# Patient Record
Sex: Male | Born: 1965 | Race: White | Hispanic: No | Marital: Married | Smoking: Current every day smoker
Health system: Southern US, Community
[De-identification: ages and names within clinical notes are randomized; demographics above are authoritative.]

## PROBLEM LIST (undated history)

## (undated) DIAGNOSIS — Z800 Family history of malignant neoplasm of digestive organs: Secondary | ICD-10-CM

## (undated) DIAGNOSIS — I10 Essential (primary) hypertension: Secondary | ICD-10-CM

## (undated) DIAGNOSIS — F32A Depression, unspecified: Secondary | ICD-10-CM

## (undated) DIAGNOSIS — I471 Supraventricular tachycardia, unspecified: Secondary | ICD-10-CM

## (undated) DIAGNOSIS — R000 Tachycardia, unspecified: Secondary | ICD-10-CM

## (undated) DIAGNOSIS — N400 Enlarged prostate without lower urinary tract symptoms: Secondary | ICD-10-CM

## (undated) DIAGNOSIS — F329 Major depressive disorder, single episode, unspecified: Secondary | ICD-10-CM

## (undated) DIAGNOSIS — N200 Calculus of kidney: Secondary | ICD-10-CM

## (undated) DIAGNOSIS — Z720 Tobacco use: Secondary | ICD-10-CM

## (undated) DIAGNOSIS — F419 Anxiety disorder, unspecified: Secondary | ICD-10-CM

## (undated) DIAGNOSIS — R1013 Epigastric pain: Secondary | ICD-10-CM

## (undated) DIAGNOSIS — E785 Hyperlipidemia, unspecified: Secondary | ICD-10-CM

## (undated) DIAGNOSIS — Z860100 Personal history of colon polyps, unspecified: Secondary | ICD-10-CM

## (undated) DIAGNOSIS — I2510 Atherosclerotic heart disease of native coronary artery without angina pectoris: Secondary | ICD-10-CM

## (undated) DIAGNOSIS — R0602 Shortness of breath: Secondary | ICD-10-CM

## (undated) DIAGNOSIS — E7800 Pure hypercholesterolemia, unspecified: Secondary | ICD-10-CM

## (undated) HISTORY — DX: Supraventricular tachycardia: I47.1

## (undated) HISTORY — DX: Family history of malignant neoplasm of digestive organs: Z80.0

## (undated) HISTORY — DX: Calculus of kidney: N20.0

## (undated) HISTORY — DX: Benign prostatic hyperplasia without lower urinary tract symptoms: N40.0

## (undated) HISTORY — DX: Supraventricular tachycardia, unspecified: I47.10

## (undated) HISTORY — DX: Anxiety disorder, unspecified: F41.9

## (undated) HISTORY — DX: Hyperlipidemia, unspecified: E78.5

## (undated) HISTORY — DX: Atherosclerotic heart disease of native coronary artery without angina pectoris: I25.10

## (undated) HISTORY — DX: Tobacco use: Z72.0

## (undated) HISTORY — DX: Epigastric pain: R10.13

## (undated) HISTORY — DX: Essential (primary) hypertension: I10

## (undated) HISTORY — DX: Shortness of breath: R06.02

## (undated) HISTORY — DX: Personal history of colon polyps, unspecified: Z86.0100

## (undated) HISTORY — DX: Personal history of colonic polyps: Z86.010

## (undated) HISTORY — PX: CARDIAC ELECTROPHYSIOLOGY STUDY AND ABLATION: SHX1294

## (undated) HISTORY — DX: Pure hypercholesterolemia, unspecified: E78.00

---

## 1998-01-22 ENCOUNTER — Emergency Department (HOSPITAL_COMMUNITY): Admission: EM | Admit: 1998-01-22 | Discharge: 1998-01-22 | Payer: Self-pay | Admitting: Emergency Medicine

## 2000-02-02 ENCOUNTER — Ambulatory Visit (HOSPITAL_COMMUNITY): Admission: RE | Admit: 2000-02-02 | Discharge: 2000-02-02 | Payer: Self-pay | Admitting: *Deleted

## 2000-02-02 ENCOUNTER — Encounter (INDEPENDENT_AMBULATORY_CARE_PROVIDER_SITE_OTHER): Payer: Self-pay | Admitting: Specialist

## 2000-11-12 ENCOUNTER — Ambulatory Visit (HOSPITAL_COMMUNITY): Admission: RE | Admit: 2000-11-12 | Discharge: 2000-11-12 | Payer: Self-pay | Admitting: *Deleted

## 2000-11-12 ENCOUNTER — Encounter (INDEPENDENT_AMBULATORY_CARE_PROVIDER_SITE_OTHER): Payer: Self-pay | Admitting: Specialist

## 2001-04-17 ENCOUNTER — Emergency Department (HOSPITAL_COMMUNITY): Admission: EM | Admit: 2001-04-17 | Discharge: 2001-04-17 | Payer: Self-pay | Admitting: Emergency Medicine

## 2006-05-28 ENCOUNTER — Emergency Department (HOSPITAL_COMMUNITY): Admission: EM | Admit: 2006-05-28 | Discharge: 2006-05-28 | Payer: Self-pay | Admitting: Emergency Medicine

## 2008-02-24 ENCOUNTER — Emergency Department (HOSPITAL_BASED_OUTPATIENT_CLINIC_OR_DEPARTMENT_OTHER): Admission: EM | Admit: 2008-02-24 | Discharge: 2008-02-24 | Payer: Self-pay | Admitting: Emergency Medicine

## 2010-10-14 NOTE — Procedures (Signed)
[facility]  Patient:    Patrick Morse, Patrick Morse                    MRN: [id number] Proc. Date: 02/02/00 Adm. Date:  [id number] Disc. Date: [id number] Attending:  Mingo Amber CC:         Marinda Elk, M.D.   Procedure Report  PROCEDURE:  Video upper endoscopy.  ENDOSCOPIST:  Roosvelt Harps, M.D.  INDICATIONS:  Persisting epigastric discomfort in a patient with a history of Helicobacter pylori gastritis.  He also probably alpha has some degree of underlying irritable bowel.  PREPARATION:  He is NPO since midnight.  PREPROCEDURE SEDATION:  He received 80 mg of Demerol and 10 mg of Versed intravenously.  In addition, his throat was anesthetized with Hurricaine spray, and he was on 2 liters of nasal cannula O2.  DESCRIPTION OF PROCEDURE:  The Olympus video upper endoscope was inserted via the mouth and advanced easily through the upper esophageal sphincter. Intubation was then carried out to the descending duodenum.  On withdrawal, the mucosa was carefully evaluated.  The descending duodenum and bulb appeared normal.  There was a small erosion of the pyloric channel seen on intubation. There was minimal body gastritis, but the antrum appeared normal.  Retroflexed view of the GE junction demonstrated a small nodule on the gastric side of doubtful significance which was biopsied x 2.  Trying to get better biopsies and evaluate the area further, the side-viewing scope was inserted after the forward viewing scope was withdrawn, but this did not really to help to clarify the nodule.  The distal esophagus appeared normal.  IMPRESSION:  Non-ulcer dyspepsia versus clinical reflux.  Minimal gastritis and a small gastroesophageal junction nodule.  There is also an erosion of the pyloric channel.  PLAN:  All of the above are relatively minor findings but I am going to place the patient on 40 mg of Protonix daily to see if he feels better.  CLO test will be  reviewed, and I will go over the results with him in a few weeks in the office. DD:  02/02/00 TD:  02/03/00 Job: [id number]

## 2011-12-15 ENCOUNTER — Emergency Department (HOSPITAL_BASED_OUTPATIENT_CLINIC_OR_DEPARTMENT_OTHER)
Admission: EM | Admit: 2011-12-15 | Discharge: 2011-12-15 | Disposition: A | Discharge status: Home / Self Care | Payer: BC Managed Care – PPO | Attending: Emergency Medicine | Admitting: Emergency Medicine

## 2011-12-15 ENCOUNTER — Encounter (HOSPITAL_BASED_OUTPATIENT_CLINIC_OR_DEPARTMENT_OTHER): Payer: Self-pay | Admitting: *Deleted

## 2011-12-15 ENCOUNTER — Emergency Department (HOSPITAL_BASED_OUTPATIENT_CLINIC_OR_DEPARTMENT_OTHER): Payer: BC Managed Care – PPO

## 2011-12-15 DIAGNOSIS — R0602 Shortness of breath: Secondary | ICD-10-CM | POA: Insufficient documentation

## 2011-12-15 DIAGNOSIS — F17200 Nicotine dependence, unspecified, uncomplicated: Secondary | ICD-10-CM | POA: Insufficient documentation

## 2011-12-15 HISTORY — DX: Major depressive disorder, single episode, unspecified: F32.9

## 2011-12-15 HISTORY — DX: Depression, unspecified: F32.A

## 2011-12-15 HISTORY — DX: Tachycardia, unspecified: R00.0

## 2011-12-15 LAB — BASIC METABOLIC PANEL
BUN: 14 mg/dL (ref 6–23)
Chloride: 100 mEq/L (ref 96–112)
GFR calc non Af Amer: 89 mL/min — ABNORMAL LOW (ref >90)
Potassium: 4.4 mEq/L (ref 3.5–5.1)

## 2011-12-15 LAB — CBC WITH DIFFERENTIAL/PLATELET
Basophils Relative: 0 % (ref 0–1)
Eosinophils Absolute: 0.1 10*3/uL (ref 0.0–0.7)
Eosinophils Relative: 1 % (ref 0–5)
Lymphs Abs: 1.9 10*3/uL (ref 0.7–4.0)
MCV: 88.9 fL (ref 78.0–100.0)
Neutro Abs: 7.9 10*3/uL — ABNORMAL HIGH (ref 1.7–7.7)
Platelets: 279 10*3/uL (ref 150–400)
WBC: 10.7 10*3/uL — ABNORMAL HIGH (ref 4.0–10.5)

## 2011-12-15 LAB — TROPONIN I: Troponin I: <0.3 ng/mL (ref <0.30)

## 2011-12-15 NOTE — ED Notes (Signed)
Pt. In no resp. Distress.  Pt. At time of discharge is eating and drinking with no noted resp. Distress.  Pt. Laughing and eating and has no cough noted and no labored breathing.

## 2011-12-15 NOTE — ED Provider Notes (Signed)
Medical screening examination/treatment/procedure(s) were performed by non-physician practitioner and as supervising physician I was immediately available for consultation/collaboration.   Charles B. Sheldon, MD 12/15/11 1541 

## 2011-12-15 NOTE — ED Notes (Signed)
Smoker with c.o sob when he works all week. This am at rest he was sob with non productive cough. Pt does not appear to be in resp distress. Pulse ox 100% r/a. Lungs are clear.

## 2011-12-15 NOTE — ED Notes (Signed)
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  States on Tues he was treated for stomach infection and prostatitis with cipro.  States the shortness of breath started after taking cipro.

## 2011-12-15 NOTE — ED Provider Notes (Signed)
History     CSN: [id number]  Arrival date & time 12/15/11  1148   First MD Initiated Contact with Patient 12/15/11 1211      Chief Complaint  Patient presents with  . Shortness of Breath    (Consider location/radiation/quality/duration/timing/severity/associated sxs/prior treatment) HPI Comments: Pt states that he is not having any cp:pt has a history of svt with similar symptoms but has not had problems since his ablation several years ago  Patient is a 46 y.o. male presenting with shortness of breath. The history is provided by the patient. No language interpreter was used.  Shortness of Breath  The current episode started 5 to 7 days ago. The problem occurs occasionally. The problem has been unchanged. The problem is moderate. Nothing relieves the symptoms. Nothing aggravates the symptoms. Associated symptoms include cough and shortness of breath. Pertinent negatives include no chest pain and no wheezing. He was not exposed to toxic fumes. He has not inhaled smoke recently. He has had no prior hospitalizations. He has had no prior ICU admissions. He has had no prior intubations.    Past Medical History  Diagnosis Date  . Tachycardia   . Depression     Past Surgical History  Procedure Date  . Cardiac electrophysiology study and ablation     No family history on file.  History  Substance Use Topics  . Smoking status: Current Everyday Smoker -- 0.5 packs/day    Types: Cigarettes  . Smokeless tobacco: Not on file  . Alcohol Use: Yes      Review of Systems  Constitutional: Negative.   Respiratory: Positive for cough and shortness of breath. Negative for wheezing.   Cardiovascular: Negative for chest pain.  Neurological: Negative.     Allergies  Review of patient's allergies indicates no known allergies.  Home Medications   Current Outpatient Rx  Name Route Sig Dispense Refill  . CIPROFLOXACIN 500 MG/5ML (10%) PO SUSR Oral Take 500 mg by mouth 2 (two) times  daily.    Marland Kitchen PAROXETINE HCL 20 MG PO TABS Oral Take 20 mg by mouth every morning.      BP 133/86  Pulse 74  Temp 98.4 F (36.9 C) (Oral)  Resp 18  Ht 5\' 9"  (1.753 m)  Wt 160 lb (72.576 kg)  BMI 23.63 kg/m2  SpO2 100%  Physical Exam  Nursing note and vitals reviewed. Constitutional: He appears well-developed and well-nourished.  HENT:  Head: Normocephalic and atraumatic.  Eyes: Conjunctivae and EOM are normal.  Neck: Neck supple.  Cardiovascular: Normal rate and regular rhythm.   Pulmonary/Chest: Effort normal and breath sounds normal.  Abdominal: Soft. Bowel sounds are normal.  Musculoskeletal: Normal range of motion.  Neurological: He is alert.  Skin: Skin is warm and dry.    ED Course  Procedures (including critical care time)  Labs Reviewed  CBC WITH DIFFERENTIAL - Abnormal; Notable for the following:    WBC 10.7 (*)     Neutro Abs 7.9 (*)     All other components within normal limits  BASIC METABOLIC PANEL - Abnormal; Notable for the following:    Glucose, Bld 107 (*)     GFR calc non Af Amer 89 (*)     All other components within normal limits  TROPONIN I   Dg Chest 2 View  12/15/2011  *RADIOLOGY REPORT*  Clinical Data: Shortness of breath, smoker  CHEST - 2 VIEW  Comparison: None  Findings: Normal heart size, mediastinal contours, and pulmonary vascularity. Lungs clear.  Bones unremarkable. No pneumothorax.  IMPRESSION: Normal exam.  Original Report Authenticated By: Lollie Marrow, M.D.    Date: 12/15/2011  Rate: 65  Rhythm: normal sinus rhythm  QRS Axis: normal  Intervals: normal  ST/T Wave abnormalities: normal  Conduction Disutrbances:none  Narrative Interpretation:   Old EKG Reviewed: none available    1. SOB (shortness of breath)       MDM  Pt not hypoxic or tachycardic:pt in no acute distress:doubt acs or ZO:[id number] with pt possibility of anxiety        Teressa Lower, NP 12/15/11 1356

## 2011-12-15 NOTE — ED Notes (Signed)
Family at bedside. 

## 2011-12-15 NOTE — ED Notes (Signed)
RT called to assess patient at triage. No distress was noted at this time. BBS clear, SAT 100% on RA. RT will continue to monitor.

## 2012-09-05 ENCOUNTER — Emergency Department (HOSPITAL_COMMUNITY)
Admission: EM | Admit: 2012-09-05 | Discharge: 2012-09-05 | Disposition: A | Discharge status: Home / Self Care | Payer: Managed Care, Other (non HMO) | Attending: Emergency Medicine | Admitting: Emergency Medicine

## 2012-09-05 ENCOUNTER — Emergency Department (HOSPITAL_COMMUNITY): Payer: Managed Care, Other (non HMO)

## 2012-09-05 ENCOUNTER — Encounter (HOSPITAL_COMMUNITY): Payer: Self-pay | Admitting: Emergency Medicine

## 2012-09-05 DIAGNOSIS — F17200 Nicotine dependence, unspecified, uncomplicated: Secondary | ICD-10-CM | POA: Insufficient documentation

## 2012-09-05 DIAGNOSIS — Z8679 Personal history of other diseases of the circulatory system: Secondary | ICD-10-CM | POA: Insufficient documentation

## 2012-09-05 DIAGNOSIS — M549 Dorsalgia, unspecified: Secondary | ICD-10-CM

## 2012-09-05 DIAGNOSIS — M545 Low back pain, unspecified: Secondary | ICD-10-CM | POA: Insufficient documentation

## 2012-09-05 DIAGNOSIS — F3289 Other specified depressive episodes: Secondary | ICD-10-CM | POA: Insufficient documentation

## 2012-09-05 DIAGNOSIS — Z79899 Other long term (current) drug therapy: Secondary | ICD-10-CM | POA: Insufficient documentation

## 2012-09-05 DIAGNOSIS — F329 Major depressive disorder, single episode, unspecified: Secondary | ICD-10-CM | POA: Insufficient documentation

## 2012-09-05 MED ORDER — OXYCODONE-ACETAMINOPHEN 7.5-325 MG PO TABS: 1.0000 | ORAL_TABLET | ORAL | Status: DC | PRN

## 2012-09-05 MED ORDER — HYDROMORPHONE HCL PF 1 MG/ML IJ SOLN
1.0000 mg | Freq: Once | INTRAMUSCULAR | Status: AC
  Administered 2012-09-05: 1 mg via INTRAVENOUS
  Filled 2012-09-05: qty 1

## 2012-09-05 MED ORDER — FENTANYL CITRATE 0.05 MG/ML IJ SOLN: 100.0000 ug | Freq: Once | INTRAMUSCULAR | Status: DC

## 2012-09-05 MED ORDER — FENTANYL CITRATE 0.05 MG/ML IJ SOLN
50.0000 ug | Freq: Once | INTRAMUSCULAR | Status: AC
  Administered 2012-09-05: 50 ug via INTRAVENOUS
  Filled 2012-09-05: qty 2

## 2012-09-05 MED ORDER — KETOROLAC TROMETHAMINE 30 MG/ML IJ SOLN
30.0000 mg | Freq: Once | INTRAMUSCULAR | Status: AC
  Administered 2012-09-05: 30 mg via INTRAVENOUS
  Filled 2012-09-05: qty 1

## 2012-09-05 MED ORDER — CYCLOBENZAPRINE HCL 10 MG PO TABS: 5.0000 mg | ORAL_TABLET | Freq: Three times a day (TID) | ORAL | Status: DC

## 2012-09-05 MED ORDER — IBUPROFEN 600 MG PO TABS: 600.0000 mg | ORAL_TABLET | Freq: Four times a day (QID) | ORAL | Status: DC | PRN

## 2012-09-05 MED ORDER — MORPHINE SULFATE 4 MG/ML IJ SOLN
4.0000 mg | Freq: Once | INTRAMUSCULAR | Status: AC
  Administered 2012-09-05: 4 mg via INTRAVENOUS
  Filled 2012-09-05: qty 1

## 2012-09-05 NOTE — ED Notes (Signed)
WUJ:[id number]<JY> Expected date:<BR> Expected time:<BR> Means of arrival:Ambulance<BR> Comments:<BR> 46yoM,back pain/hs of bulging disc

## 2012-09-05 NOTE — ED Notes (Signed)
Patient transported to MRI 

## 2012-09-05 NOTE — ED Notes (Signed)
MD at bedside. 

## 2012-09-05 NOTE — ED Notes (Signed)
Pt complains of lower back pain

## 2012-09-05 NOTE — ED Provider Notes (Signed)
History     CSN: [id number]  Arrival date & time 09/05/12  1330   First MD Initiated Contact with Patient 09/05/12 1432      Chief Complaint  Patient presents with  . Back Pain    (Consider location/radiation/quality/duration/timing/severity/associated sxs/prior treatment) HPI Comments: Pt presenting to the ED for increasing low back pain x 7 days.  Currently works as a Systems developer and moving heavy object but does not recall a specific event that set off his pain.  No recent trauma or injury.  Pain is worse with walking, so severe today that he was unable to ambulate at home PTA.  Notes that when he walks, he feels like his pelvis is tilted to the right.  Has been seeing the chiropractor for approx 2 weeks without significant improvement of his sx.  Prior back injury several years ago that was treated with cortisone injections.  Denies any numbness or paresthesias of LE.  No loss of bowel or bladder function.    The history is provided by the patient.    Past Medical History  Diagnosis Date  . Tachycardia   . Depression     Past Surgical History  Procedure Laterality Date  . Cardiac electrophysiology study and ablation      No family history on file.  History  Substance Use Topics  . Smoking status: Current Every Day Smoker -- 0.50 packs/day    Types: Cigarettes  . Smokeless tobacco: Not on file  . Alcohol Use: Yes      Review of Systems  Musculoskeletal: Positive for back pain.  All other systems reviewed and are negative.    Allergies  Review of patient's allergies indicates no known allergies.  Home Medications   Current Outpatient Rx  Name  Route  Sig  Dispense  Refill  . acetaminophen (TYLENOL) 325 MG tablet   Oral   Take 650 mg by mouth every 6 (six) hours as needed for pain.         Marland Kitchen FLUoxetine (PROZAC) 20 MG capsule   Oral   Take 20 mg by mouth daily.         Marland Kitchen HYDROcodone-acetaminophen (VICODIN) 5-500 MG per tablet   Oral   Take 1  tablet by mouth every 6 (six) hours as needed for pain.           BP 149/90  Pulse 82  Temp(Src) 98.9 F (37.2 C) (Oral)  Resp 19  SpO2 97%  Physical Exam  Nursing note and vitals reviewed. Constitutional: He is oriented to person, place, and time. He appears well-developed and well-nourished.  HENT:  Head: Normocephalic and atraumatic.  Mouth/Throat: Oropharynx is clear and moist.  Eyes: Conjunctivae and EOM are normal. Pupils are equal, round, and reactive to light.  Neck: Normal range of motion.  Cardiovascular: Normal rate, regular rhythm and normal heart sounds.   Pulmonary/Chest: Effort normal and breath sounds normal.  Abdominal: Soft. Bowel sounds are normal. There is no tenderness. There is no guarding.  Musculoskeletal: Normal range of motion. He exhibits no edema.       Lumbar back: He exhibits tenderness, bony tenderness and pain. He exhibits no swelling, no edema, no deformity, no laceration, no spasm and normal pulse.  LE sensation intact, strong distal pulse and cap refill  Neurological: He is alert and oriented to person, place, and time.  Skin: Skin is warm and dry.  Psychiatric: He has a normal mood and affect.    ED Course  Procedures (  including critical care time)  Labs Reviewed - No data to display Dg Lumbar Spine Complete  09/05/2012  *RADIOLOGY REPORT*  Clinical Data: Left back pain  LUMBAR SPINE - COMPLETE 4+ VIEW  Comparison: MRI lumbar spine dated 08/20/2006  Findings: Five lumbar-type vertebral bodies.  Normal lumbar lordosis.  No evidence of fracture or dislocation.  Vertebral body heights are maintained.  Mild degenerative changes at L4-5 and L5-S1.  Visualized bony pelvis appears intact.  IMPRESSION: No fracture or dislocation is seen.  Mild degenerative changes at L4-5 and L5-S1.   Original Report Authenticated By: Charline Bills, M.D.    Mr Lumbar Spine Wo Contrast  09/05/2012  *RADIOLOGY REPORT*  Clinical Data: Back pain.  Unable to walk.   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  MRI LUMBAR SPINE WITHOUT CONTRAST  Technique:  Multiplanar and multiecho pulse sequences of the lumbar spine were obtained without intravenous contrast.  Comparison: Lumbar spine radiographs 09/05/2012.  MRI lumbar spine without and with contrast 08/20/2006.  Findings: Normal signal is present in the conus medullaris which terminates at L1.  Marrow signal is mildly heterogeneous. Limited imaging of the abdomen is unremarkable.  The disc levels at L3-4 and above are normal.  L4-5:  A leftward disc herniation is slightly more prominent than on the prior study.  This results in mild left foraminal stenosis. The central canal is patent.  L5-S1:  A mild broad-based disc herniation is similar to the prior study.  Facet hypertrophy is present as well.  This results in mild bilateral foraminal narrowing, slightly worse than on the prior exam.  IMPRESSION:  1.  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 2.  Progression of mild bilateral foraminal narrowing at L5-S1. 3.  No other significant disc herniation or stenosis.   Original Report Authenticated By: Marin Roberts, M.D.      1. Back pain       MDM   Pt presenting to the ED with low back pain increasing over the past 7 days.  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  Attempted to obtain records from chiropractors office but x-rays were illegible.  X-rays repeated here in the ED revealed mild DJD L4-L5, L5-S1.  Pt adamant that he is unable to walk.  Discussed with Dr. Radford Pax and obtained MRI which revealed slightly worsening foraminal narrowing when compared to previous study from 2008.  Long discussion with pt and family members about DJD and herniated discs- will need monitoring over time.  Pt has previously scheduled FU appt tomorrow with [facility]- Dr. Regino Schultze.  Encouraged to keep this appt.  Rx percocet, flexeril, ibuprofen.  Pain control will be achieved prior to d/c- Dr.  Radford Pax to monitor and set disposition when appropriate.       Garlon Hatchet, PA-C 09/06/12 1002

## 2012-09-08 NOTE — ED Provider Notes (Signed)
Medical screening examination/treatment/procedure(s) were conducted as a shared visit with non-physician practitioner(s) and myself.  I personally evaluated the patient during the encounter   Nelia Shi, MD 09/08/12 1328

## 2020-01-13 ENCOUNTER — Ambulatory Visit: Payer: Self-pay

## 2020-01-13 ENCOUNTER — Other Ambulatory Visit: Payer: Self-pay

## 2020-01-13 DIAGNOSIS — Z20822 Contact with and (suspected) exposure to covid-19: Secondary | ICD-10-CM

## 2020-01-14 LAB — SARS-COV-2, NAA 2 DAY TAT

## 2020-01-14 LAB — NOVEL CORONAVIRUS, NAA: SARS-CoV-2, NAA: NOT DETECTED

## 2020-01-15 ENCOUNTER — Ambulatory Visit: Payer: 59 | Attending: Internal Medicine

## 2020-01-15 DIAGNOSIS — Z23 Encounter for immunization: Secondary | ICD-10-CM

## 2020-01-15 NOTE — Progress Notes (Signed)
   Covid-19 Vaccination Clinic  Name:  ARMAS MCBEE    MRN: [id number] DOB: Dec 14, 1965  01/15/2020  Mr. Haslam was observed post Covid-19 immunization for 15 minutes without incident. He was provided with Vaccine Information Sheet and instruction to access the V-Safe system.   Mr. Avitabile was instructed to call 911 with any severe reactions post vaccine: Marland Kitchen Difficulty breathing  . Swelling of face and throat  . A fast heartbeat  . A bad rash all over body  . Dizziness and weakness   Immunizations Administered    Name Date Dose VIS Date Route   Pfizer COVID-19 Vaccine 01/15/2020  9:44 AM 0.3 mL 07/23/2018 Intramuscular   Manufacturer: [employer], [employer]   Lot: [id number]   NDC: [address]-7

## 2020-02-05 ENCOUNTER — Ambulatory Visit: Payer: Managed Care, Other (non HMO) | Attending: Critical Care Medicine

## 2020-02-05 DIAGNOSIS — Z23 Encounter for immunization: Secondary | ICD-10-CM

## 2020-02-05 NOTE — Progress Notes (Signed)
   Covid-19 Vaccination Clinic  Name:  Patrick Morse    MRN: [id number] DOB: 1966/05/02  02/05/2020  [name] was observed post Covid-19 immunization for 15 minutes without incident. He was provided with Vaccine Information Sheet and instruction to access the V-Safe system.   [name] was instructed to call 911 with any severe reactions post vaccine: Marland Kitchen Difficulty breathing  . Swelling of face and throat  . A fast heartbeat  . A bad rash all over body  . Dizziness and weakness   Immunizations Administered    Name Date Dose VIS Date Route   Pfizer COVID-19 Vaccine 02/05/2020  9:29 AM 0.3 mL 07/23/2018 Intramuscular   Manufacturer: [employer], [employer]   Lot: [id number]   NDC: [address]-9

## 2020-02-10 ENCOUNTER — Other Ambulatory Visit: Payer: Self-pay

## 2020-02-12 ENCOUNTER — Ambulatory Visit: Payer: Self-pay | Admitting: Family Medicine

## 2020-04-21 ENCOUNTER — Encounter: Payer: Self-pay | Admitting: Cardiology

## 2020-04-21 ENCOUNTER — Other Ambulatory Visit: Payer: Self-pay

## 2020-04-21 ENCOUNTER — Ambulatory Visit: Payer: 59 | Admitting: Cardiology

## 2020-04-21 VITALS — BP 128/82 | HR 63 | Ht 69.0 in | Wt 159.0 lb

## 2020-04-21 DIAGNOSIS — R0600 Dyspnea, unspecified: Secondary | ICD-10-CM

## 2020-04-21 DIAGNOSIS — R0609 Other forms of dyspnea: Secondary | ICD-10-CM

## 2020-04-21 DIAGNOSIS — I10 Essential (primary) hypertension: Secondary | ICD-10-CM | POA: Diagnosis not present

## 2020-04-21 DIAGNOSIS — I471 Supraventricular tachycardia: Secondary | ICD-10-CM | POA: Diagnosis not present

## 2020-04-21 MED ORDER — METOPROLOL TARTRATE 100 MG PO TABS: ORAL_TABLET | ORAL | 0 refills | Status: DC

## 2020-04-21 NOTE — Patient Instructions (Addendum)
Medication Instructions:  Your physician recommends that you continue on your current medications as directed. Please refer to the Current Medication list given to you today.  *If you need a refill on your cardiac medications before your next appointment, please call your pharmacy*   Testing/Procedures: Your physician has requested that you have an echocardiogram. Echocardiography is a painless test that uses sound waves to create images of your heart. It provides your doctor with information about the size and shape of your heart and how well your heart's chambers and valves are working. This procedure takes approximately one hour. There are no restrictions for this procedure.  Your physician has requested that you have a coronary CTA scan done. Please see next page for further instructions.  Follow-Up: At [facility], you and your health needs are our priority.  As part of our continuing mission to provide you with exceptional heart care, we have created designated Provider Care Teams.  These Care Teams include your primary Cardiologist (physician) and Advanced Practice Providers (APPs -  Physician Assistants and Nurse Practitioners) who all work together to provide you with the care you need, when you need it.  Follow up with Dr. Radford Pax as needed based on results of testing.    Other Instructions Your cardiac CT will be scheduled at:   [facility] [address] [place], [place] [address] [phone]  Please arrive at the [facility] main entrance of [facility] 30 minutes prior to test start time. Proceed to the [facility] Radiology Department (first floor) to check-in and test prep.  Please follow these instructions carefully (unless otherwise directed):  Hold all erectile dysfunction medications at least 3 days (72 hrs) prior to test.  On the Night Before the Test: . Be sure to Drink plenty of water. . Do not consume any caffeinated/decaffeinated  beverages or chocolate 12 hours prior to your test. . Do not take any antihistamines 12 hours prior to your test.  On the Day of the Test: . Drink plenty of water. Do not drink any water within one hour of the test. . Do not eat any food 4 hours prior to the test. . You may take your regular medications prior to the test.  . Take metoprolol (Lopressor) two hours prior to test.  After the Test: . Drink plenty of water. . After receiving IV contrast, you may experience a mild flushed feeling. This is normal. . On occasion, you may experience a mild rash up to 24 hours after the test. This is not dangerous. If this occurs, you can take Benadryl 25 mg and increase your fluid intake. . If you experience trouble breathing, this can be serious. If it is severe call 911 IMMEDIATELY. If it is mild, please call our office. . If you take any of these medications: Glipizide/Metformin, Avandament, Glucavance, please do not take 48 hours after completing test unless otherwise instructed.   Once we have confirmed authorization from your insurance company, we will call you to set up a date and time for your test. Based on how quickly your insurance processes prior authorizations requests, please allow up to 4 weeks to be contacted for scheduling your Cardiac CT appointment. Be advised that routine Cardiac CT appointments could be scheduled as many as 8 weeks after your provider has ordered it.  For non-scheduling related questions, please contact the cardiac imaging nurse navigator should you have any questions/concerns: Patrick Morse, Cardiac Imaging Nurse Navigator [place], [facility]  [facility] Direct Office Dial: [phone]   For scheduling needs, including cancellations and rescheduling, please call Patrick Morse, [phone] (temporary number).

## 2020-04-21 NOTE — Progress Notes (Signed)
Cardiology Consult  Note    Date:  04/21/2020   ID:  JAXXSON CAVANAH, DOB Oct 06, 1965, MRN [id number]  PCP:  Joycelyn Rua, MD  Cardiologist:  Armanda Magic, MD   Chief Complaint  Patient presents with  . New Patient (Initial Visit)    DOE    History of Present Illness:  Patrick Morse is a 54 y.o. male who is being seen today for the evaluation of DOE at the request of Joycelyn Rua, MD.  This is a 54yo male with a hx of depression and anxiety, HLD, HTN, SVT s/p remote ablation (2003 [facility] Dr. Sampson Goon) and tobacco abuse who is referred for evaluation of DOE.  He says that he has had a dull HA for several months and fatigue.  He says that when he is driving down the road he will have sudden onset of diaphoresis, DOE and dizziness.  His father had a CVA in his mid 72's.  He does smoke < 1ppd for about 30 years.  He has noticed that he will feel SOB and the last time it happened was when he was climbing up a hole at work and got out of breath.  He has noticed that his SOB can happen at anytime both at rest and with exertion.  He denies any LE edema, PND, orthopnea or syncope.  Occasionally he will notice a skipped heart beat.  He says that he is very stressed out recently and has 2 jobs and 2 kids and recently separated from his wife.   Past Medical History:  Diagnosis Date  . Anxiety   . Bilateral kidney stones   . BPH (benign prostatic hyperplasia)   . Depression   . Dyspepsia   . Epigastric pain   . Family hx of colon cancer   . High cholesterol   . Hx of colonic polyps   . Hyperlipidemia   . Hypertension   . SOB (shortness of breath)   . SVT (supraventricular tachycardia) [facility])    s/p ablation at [facility] Dr. Sampson Goon 2003  . Tobacco abuse     Past Surgical History:  Procedure Laterality Date  . CARDIAC ELECTROPHYSIOLOGY STUDY AND ABLATION      Current Medications: Current Meds  Medication Sig  . acetaminophen (TYLENOL) 325 MG tablet Take 650 mg by mouth  every 6 (six) hours as needed for pain.  Marland Kitchen ibuprofen (ADVIL,MOTRIN) 600 MG tablet Take 1 tablet (600 mg total) by mouth every 6 (six) hours as needed for pain.  Marland Kitchen lisinopril (ZESTRIL) 10 MG tablet Take 10 mg by mouth daily.  . [DISCONTINUED] tamsulosin (FLOMAX) 0.4 MG CAPS capsule Take by mouth.    Allergies:   Patient has no known allergies.   Social History   Socioeconomic History  . Marital status: Married    Spouse name: Not on file  . Number of children: Not on file  . Years of education: Not on file  . Highest education level: Not on file  Occupational History  . Not on file  Tobacco Use  . Smoking status: Current Every Day Smoker    Packs/day: 0.50    Types: Cigarettes  . Smokeless tobacco: Current User  Substance and Sexual Activity  . Alcohol use: Yes  . Drug use: Yes    Types: Marijuana  . Sexual activity: Not on file  Other Topics Concern  . Not on file  Social History Narrative  . Not on file   Social Determinants of Health   Financial  Resource Strain:   . Difficulty of Paying Living Expenses: Not on file  Food Insecurity:   . Worried About Programme researcher, broadcasting/film/video in the Last Year: Not on file  . Ran Out of Food in the Last Year: Not on file  Transportation Needs:   . Lack of Transportation (Medical): Not on file  . Lack of Transportation (Non-Medical): Not on file  Physical Activity:   . Days of Exercise per Week: Not on file  . Minutes of Exercise per Session: Not on file  Stress:   . Feeling of Stress : Not on file  Social Connections:   . Frequency of Communication with Friends and Family: Not on file  . Frequency of Social Gatherings with Friends and Family: Not on file  . Attends Religious Services: Not on file  . Active Member of Clubs or Organizations: Not on file  . Attends Banker Meetings: Not on file  . Marital Status: Not on file     Family History:  The patient's family history is not on file.   ROS:   Please see the  history of present illness.    ROS All other systems reviewed and are negative.  No flowsheet data found.     PHYSICAL EXAM:   VS:  BP 128/82   Pulse 63   Ht 5\' 9"  (1.753 m)   Wt 159 lb (72.1 kg)   SpO2 98%   BMI 23.48 kg/m    GEN: Well nourished, well developed, in no acute distress  HEENT: normal  Neck: no JVD, carotid bruits, or masses Cardiac: RRR; no murmurs, rubs, or gallops,no edema.  Intact distal pulses bilaterally.  Respiratory:  clear to auscultation bilaterally, normal work of breathing GI: soft, nontender, nondistended, + BS MS: no deformity or atrophy  Skin: warm and dry, no rash Neuro:  Alert and Oriented x 3, Strength and sensation are intact Psych: euthymic mood, full affect  Wt Readings from Last 3 Encounters:  04/21/20 159 lb (72.1 kg)  12/15/11 160 lb (72.6 kg)      Studies/Labs Reviewed:   EKG:  EKG is ordered today.  The ekg ordered today demonstrates NSR with normal intervals  Recent Labs: No results found for requested labs within last 8760 hours.   Lipid Panel No results found for: CHOL, TRIG, HDL, CHOLHDL, VLDL, LDLCALC, LDLDIRECT     Additional studies/ records that were reviewed today include:  EKG    ASSESSMENT:    1. DOE (dyspnea on exertion)   2. Primary hypertension   3. SVT (supraventricular tachycardia) (HCC)      PLAN:  In order of problems listed above:  1. DOE -I am concerned about the symptoms he is having>>he will have sudden onset of diaphoresis, fatigue and SOB both with exertion and at rest -his CRFs include HTN, HLD, tobacco abuse and fm hx of vascular dz with his father having CVA in his 79's -check 2D echo to assess LVF -coronary CTA to define coronary anatomy  2. HTN -BP controlled on exam -continue Lisinopril 5 mg daily  3.  SVT -s/p remote ablation 2003 -he has not had any further tachycardia    Medication Adjustments/Labs and Tests Ordered: Current medicines are reviewed at length with the  patient today.  Concerns regarding medicines are outlined above.  Medication changes, Labs and Tests ordered today are listed in the Patient Instructions below.  There are no Patient Instructions on file for this visit.   Signed, 2004,  MD  04/21/2020 9:22 AM    [facility] [address] [place], [place], [place]  [address] Phone: [phone]; Fax: [phone]

## 2020-04-21 NOTE — Addendum Note (Signed)
Addended by: Theresia Majors on: 04/21/2020 09:30 AM   Modules accepted: Orders

## 2020-04-29 ENCOUNTER — Telehealth: Payer: Self-pay | Admitting: Cardiology

## 2020-04-29 ENCOUNTER — Telehealth: Payer: Self-pay | Admitting: Radiology

## 2020-04-29 DIAGNOSIS — R55 Syncope and collapse: Secondary | ICD-10-CM

## 2020-04-29 DIAGNOSIS — R42 Dizziness and giddiness: Secondary | ICD-10-CM

## 2020-04-29 NOTE — Telephone Encounter (Signed)
Enrolled patient for a 14 day Zio AT monitor to be mailed to patients home.  

## 2020-04-29 NOTE — Telephone Encounter (Signed)
Will place order for Zio monitor. Called and updated patient with Dr. Norris Cross additional recommendations.   Corrin Parker, PA-C 04/29/2020 4:03 PM

## 2020-04-29 NOTE — Telephone Encounter (Signed)
Reviewed chart. Patient recently seen by Dr. Mayford Knife on 04/21/2020 for further evaluation of dyspnea on exertion. He reported sudden onset of diaphoresis, dyspnea on exertion, and dizziness. Coronary CTA and Echo were ordered for further evaluation but have not been done yet. Called and spoke with patient. He reports he continues to have episodes of dizziness and states he feels like this is beginning to interfere with his ability to work. 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 One time he felt like he was going to pass out. No syncope though. Yesterday, he also had severe headache along the back of his head. No stroke like symptoms. No chest pain. Vitals normal with BP in the 130's/80's and HR in the 80's as well.  Will route note to Dr. Mayford Knife for additional recommendations sent she saw him last week for this. However, I did recommend that he go to the ED if he has worsening symptoms such as severe dizziness or near syncope/syncope.   Corrin Parker, PA-C 04/29/2020 3:03 PM

## 2020-04-29 NOTE — Telephone Encounter (Signed)
STAT if patient feels like he/she is going to faint   1) Are you dizzy now? yes  2) Do you feel faint or have you passed out? Pt just feels faint  3) Do you have any other symptoms? headache  4) Have you checked your HR and BP (record if available)? no  Patient says his sx are keeping him from doing his job. He wanted to go to the ER but its not sure what to do. Please advise

## 2020-04-29 NOTE — Telephone Encounter (Signed)
Please get an 2 week ziopatch and have him check his BP when he has these episodes

## 2020-05-04 ENCOUNTER — Ambulatory Visit (INDEPENDENT_AMBULATORY_CARE_PROVIDER_SITE_OTHER): Payer: 59

## 2020-05-04 DIAGNOSIS — R55 Syncope and collapse: Secondary | ICD-10-CM

## 2020-05-04 DIAGNOSIS — R42 Dizziness and giddiness: Secondary | ICD-10-CM | POA: Diagnosis not present

## 2020-05-04 NOTE — Telephone Encounter (Signed)
Misty Stanley with [employer] is calling with an update regarding Zio monitor. She states the patient declined wearing the monitor due to expenses.   Phone#: [phone] (reference#: [id number])

## 2020-05-05 ENCOUNTER — Telehealth: Payer: Self-pay | Admitting: Cardiology

## 2020-05-05 ENCOUNTER — Telehealth (HOSPITAL_COMMUNITY): Payer: Self-pay | Admitting: *Deleted

## 2020-05-05 NOTE — Telephone Encounter (Signed)
Reaching out to patient to offer assistance regarding upcoming cardiac imaging study; pt verbalizes understanding of appt date/time, parking situation and where to check in, pre-test NPO status and medications ordered, and verified current allergies; name and call back number provided for further questions should they arise  [place] [facility] [phone] office [phone] cell

## 2020-05-05 NOTE — Telephone Encounter (Signed)
Pt called in and stated that he had a echo and ct sched.  Per ins it is going to cost him 1200 each and he can not afford that.  He is wearing the monitor , he would like to know how he should proceed from here ?     Best number [phone]

## 2020-05-06 NOTE — Telephone Encounter (Signed)
Spoke with the patient who states that he is not able to afford both the echocardiogram and CT scan. He would like to know if he was only to get one done which one Dr. Mayford Knife would prefer.  He would also like to know about a payment plan for the tests. I advised him that I would send a message to our billing department for further advisement.

## 2020-05-07 ENCOUNTER — Ambulatory Visit (HOSPITAL_COMMUNITY): Admission: RE | Admit: 2020-05-07 | Payer: 59 | Source: Ambulatory Visit

## 2020-05-07 NOTE — Telephone Encounter (Signed)
Coronary CTA

## 2020-05-10 NOTE — Telephone Encounter (Signed)
Left message for patient advising him that Dr. Mayford Knife recommends that he have the coronary CTA scan done if he is only able to go through with one of the tests. Advised to call back with any questions.

## 2020-05-11 NOTE — Telephone Encounter (Signed)
Spoke with the patient who states that he would like to go through we the coronary CTA. I have cancelled his echocardiogram. Advised him that I would route to our CT schedulers who will be in contact with him to have it set up.

## 2020-05-18 ENCOUNTER — Other Ambulatory Visit (HOSPITAL_COMMUNITY): Payer: 59

## 2020-05-27 ENCOUNTER — Telehealth (HOSPITAL_COMMUNITY): Payer: Self-pay | Admitting: Emergency Medicine

## 2020-05-27 NOTE — Telephone Encounter (Signed)
Reaching out to patient to offer assistance regarding upcoming cardiac imaging study; pt verbalizes understanding of appt date/time, parking situation and where to check in, pre-test NPO status and medications ordered, and verified current allergies; name and call back number provided for further questions should they arise Tyra Gural RN Navigator Cardiac Imaging [facility] [phone] office [phone] cell  Pt to take 100mg metop 2 hr prior to scan Merek Niu  

## 2020-05-31 ENCOUNTER — Encounter (HOSPITAL_COMMUNITY): Payer: Self-pay

## 2020-05-31 ENCOUNTER — Ambulatory Visit (HOSPITAL_COMMUNITY)
Admission: RE | Admit: 2020-05-31 | Discharge: 2020-05-31 | Disposition: A | Discharge status: Home / Self Care | Payer: 59 | Source: Ambulatory Visit | Attending: Cardiology | Admitting: Cardiology

## 2020-05-31 ENCOUNTER — Other Ambulatory Visit: Payer: Self-pay

## 2020-05-31 DIAGNOSIS — I2510 Atherosclerotic heart disease of native coronary artery without angina pectoris: Secondary | ICD-10-CM

## 2020-05-31 DIAGNOSIS — R0600 Dyspnea, unspecified: Secondary | ICD-10-CM | POA: Diagnosis present

## 2020-05-31 DIAGNOSIS — R0609 Other forms of dyspnea: Secondary | ICD-10-CM

## 2020-05-31 MED ORDER — NITROGLYCERIN 0.4 MG SL SUBL
0.8000 mg | SUBLINGUAL_TABLET | Freq: Once | SUBLINGUAL | Status: AC
  Administered 2020-05-31: 0.8 mg via SUBLINGUAL

## 2020-05-31 MED ORDER — IOHEXOL 350 MG/ML SOLN
80.0000 mL | Freq: Once | INTRAVENOUS | Status: AC
  Administered 2020-05-31: 80 mL via INTRAVENOUS

## 2020-05-31 MED ORDER — NITROGLYCERIN 0.4 MG SL SUBL
SUBLINGUAL_TABLET | SUBLINGUAL | Status: AC
  Filled 2020-05-31: qty 2

## 2020-06-01 ENCOUNTER — Telehealth: Payer: Self-pay

## 2020-06-01 ENCOUNTER — Other Ambulatory Visit: Payer: Self-pay

## 2020-06-01 DIAGNOSIS — R0600 Dyspnea, unspecified: Secondary | ICD-10-CM

## 2020-06-01 DIAGNOSIS — I2583 Coronary atherosclerosis due to lipid rich plaque: Secondary | ICD-10-CM

## 2020-06-01 DIAGNOSIS — R0609 Other forms of dyspnea: Secondary | ICD-10-CM

## 2020-06-01 DIAGNOSIS — I2510 Atherosclerotic heart disease of native coronary artery without angina pectoris: Secondary | ICD-10-CM

## 2020-06-01 MED ORDER — ASPIRIN EC 81 MG PO TBEC: 81.0000 mg | DELAYED_RELEASE_TABLET | Freq: Every day | ORAL | 3 refills | Status: DC

## 2020-06-01 NOTE — Progress Notes (Signed)
Attestation for [employer] needs to be completed.

## 2020-06-01 NOTE — Telephone Encounter (Signed)
-----   Message from Quintella Reichert, MD sent at 05/31/2020  2:43 PM EST ----- Coronary CTA showed mild atherosclerosis of the LAD.  FFR has been submitted and is pending.Please get a copy of last FLP and ALT. Start ASA 81mg  daily

## 2020-06-01 NOTE — Progress Notes (Signed)
Shared Decision Making/Informed Consent The risks [chest pain, shortness of breath, cardiac arrhythmias, dizziness, blood pressure fluctuations, myocardial infarction, stroke/transient ischemic attack, nausea, vomiting, allergic reaction, radiation exposure, metallic taste sensation and life-threatening complications (estimated to be 1 in 10,000)], benefits (risk stratification, diagnosing coronary artery disease, treatment guidance) and alternatives of a nuclear stress test were discussed in detail with Patrick Morse and he agrees to proceed.

## 2020-06-01 NOTE — Telephone Encounter (Signed)
Patrick Reichert, MD  06/01/2020 9:52 AM EST      Normal coronary blood flow but could not assess mid to distal LAD where there was possible moderate CAD on coronary CTA. Please get a Lexiscan myoview to rule out ischemia     The patient has been notified of the result and verbalized understanding.  All questions (if any) were answered. Theresia Majors, RN 06/01/2020 4:52 PM  Patient will start on ASA 81 mg daily.

## 2020-06-02 ENCOUNTER — Telehealth (HOSPITAL_COMMUNITY): Payer: Self-pay | Admitting: *Deleted

## 2020-06-02 NOTE — Telephone Encounter (Signed)
Patient given detailed instructions per Myocardial Perfusion Study Information Sheet for the test on 06/07/20 at 10:15. Patient notified to arrive 15 minutes early and that it is imperative to arrive on time for appointment to keep from having the test rescheduled.  If you need to cancel or reschedule your appointment, please call the office within 24 hours of your appointment. . Patient verbalized understanding.Patrick Morse

## 2020-06-07 ENCOUNTER — Encounter: Payer: Self-pay | Admitting: Cardiology

## 2020-06-07 ENCOUNTER — Other Ambulatory Visit: Payer: Self-pay

## 2020-06-07 ENCOUNTER — Ambulatory Visit (HOSPITAL_COMMUNITY): Payer: 59 | Attending: Cardiology

## 2020-06-07 ENCOUNTER — Other Ambulatory Visit: Payer: 59 | Admitting: *Deleted

## 2020-06-07 DIAGNOSIS — R0600 Dyspnea, unspecified: Secondary | ICD-10-CM | POA: Diagnosis present

## 2020-06-07 DIAGNOSIS — R0609 Other forms of dyspnea: Secondary | ICD-10-CM

## 2020-06-07 DIAGNOSIS — I2510 Atherosclerotic heart disease of native coronary artery without angina pectoris: Secondary | ICD-10-CM | POA: Insufficient documentation

## 2020-06-07 LAB — MYOCARDIAL PERFUSION IMAGING
LV dias vol: 72 mL (ref 62–150)
LV sys vol: 31 mL
Peak HR: 105 {beats}/min
Rest HR: 64 {beats}/min
SDS: 0
SRS: 0
SSS: 0
TID: 1

## 2020-06-07 LAB — LIPID PANEL
Chol/HDL Ratio: 3.8 ratio (ref 0.0–5.0)
Cholesterol, Total: 215 mg/dL — ABNORMAL HIGH (ref 100–199)
HDL: 56 mg/dL (ref >39)
LDL Chol Calc (NIH): 147 mg/dL — ABNORMAL HIGH (ref 0–99)
Triglycerides: 67 mg/dL (ref 0–149)
VLDL Cholesterol Cal: 12 mg/dL (ref 5–40)

## 2020-06-07 LAB — ALT: ALT: 14 IU/L (ref 0–44)

## 2020-06-07 MED ORDER — TECHNETIUM TC 99M SESTAMIBI GENERIC - CARDIOLITE
10.2000 | Freq: Once | INTRAVENOUS | Status: AC | PRN
  Administered 2020-06-07: 10.2 via INTRAVENOUS
  Filled 2020-06-07: qty 11

## 2020-06-07 MED ORDER — REGADENOSON 0.4 MG/5ML IV SOLN
0.4000 mg | Freq: Once | INTRAVENOUS | Status: AC
  Administered 2020-06-07: 0.4 mg via INTRAVENOUS

## 2020-06-07 MED ORDER — TECHNETIUM TC 99M TETROFOSMIN IV KIT
31.3000 | PACK | Freq: Once | INTRAVENOUS | Status: AC | PRN
  Administered 2020-06-07: 31.3 via INTRAVENOUS
  Filled 2020-06-07: qty 32

## 2020-06-08 ENCOUNTER — Telehealth: Payer: Self-pay

## 2020-06-08 ENCOUNTER — Telehealth: Payer: Self-pay | Admitting: Cardiology

## 2020-06-08 DIAGNOSIS — I2510 Atherosclerotic heart disease of native coronary artery without angina pectoris: Secondary | ICD-10-CM

## 2020-06-08 MED ORDER — ATORVASTATIN CALCIUM 20 MG PO TABS: 20.0000 mg | ORAL_TABLET | Freq: Every day | ORAL | 3 refills | Status: DC

## 2020-06-08 NOTE — Telephone Encounter (Signed)
Patient returning Patrick Morse's call for results.

## 2020-06-08 NOTE — Telephone Encounter (Signed)
The patient has been notified of the result and verbalized understanding.  All questions (if any) were answered. Theresia Majors, RN 06/08/2020 4:59 PM  Lipitor has been sent in. Orders for repeat lab work have been placed.

## 2020-06-08 NOTE — Telephone Encounter (Signed)
-----   Message from Quintella Reichert, MD sent at 06/07/2020  5:18 PM EST ----- LDL is too high, goal < 70.  Start Lipitor 20mg  daily and repeat FLP and ALT in 6 weeks

## 2020-06-09 NOTE — Telephone Encounter (Signed)
The patient has been notified of the result and verbalized understanding.  All questions (if any) were answered. Theresia Majors, RN 06/09/2020 11:59 AM

## 2020-06-22 ENCOUNTER — Telehealth: Payer: Self-pay | Admitting: Cardiology

## 2020-06-22 NOTE — Telephone Encounter (Signed)
Maria from [employer] is calling to see if the data sent from this patients temporary monitor is sufficient. Ref # [id number]. Please advise.

## 2020-06-22 NOTE — Telephone Encounter (Signed)
Patient's monitor gave [place] sufficient data.

## 2020-07-09 ENCOUNTER — Other Ambulatory Visit: Payer: Self-pay | Admitting: Physician Assistant

## 2020-07-09 DIAGNOSIS — R1013 Epigastric pain: Secondary | ICD-10-CM

## 2020-07-22 ENCOUNTER — Other Ambulatory Visit: Payer: Self-pay

## 2020-07-22 ENCOUNTER — Other Ambulatory Visit: Payer: 59

## 2020-07-22 DIAGNOSIS — I2583 Coronary atherosclerosis due to lipid rich plaque: Secondary | ICD-10-CM

## 2020-07-22 DIAGNOSIS — I2510 Atherosclerotic heart disease of native coronary artery without angina pectoris: Secondary | ICD-10-CM

## 2020-07-22 LAB — LIPID PANEL
Chol/HDL Ratio: 3 ratio (ref 0.0–5.0)
Cholesterol, Total: 139 mg/dL (ref 100–199)
HDL: 46 mg/dL (ref >39)
LDL Chol Calc (NIH): 82 mg/dL (ref 0–99)
Triglycerides: 52 mg/dL (ref 0–149)
VLDL Cholesterol Cal: 11 mg/dL (ref 5–40)

## 2020-07-22 LAB — ALT: ALT: 26 IU/L (ref 0–44)

## 2020-07-26 ENCOUNTER — Ambulatory Visit
Admission: RE | Admit: 2020-07-26 | Discharge: 2020-07-26 | Disposition: A | Discharge status: Home / Self Care | Payer: 59 | Source: Ambulatory Visit | Attending: Physician Assistant | Admitting: Physician Assistant

## 2020-07-26 DIAGNOSIS — R1013 Epigastric pain: Secondary | ICD-10-CM

## 2021-05-10 ENCOUNTER — Ambulatory Visit: Payer: 59 | Admitting: Family Medicine

## 2021-05-10 ENCOUNTER — Other Ambulatory Visit: Payer: Self-pay

## 2021-05-10 ENCOUNTER — Ambulatory Visit (INDEPENDENT_AMBULATORY_CARE_PROVIDER_SITE_OTHER): Payer: 59

## 2021-05-10 VITALS — BP 120/72 | HR 78 | Ht 69.0 in | Wt 166.0 lb

## 2021-05-10 DIAGNOSIS — G8929 Other chronic pain: Secondary | ICD-10-CM | POA: Diagnosis not present

## 2021-05-10 DIAGNOSIS — M545 Low back pain, unspecified: Secondary | ICD-10-CM

## 2021-05-10 MED ORDER — TIZANIDINE HCL 2 MG PO TABS: 2.0000 mg | ORAL_TABLET | Freq: Every day | ORAL | 0 refills | Status: DC

## 2021-05-10 MED ORDER — KETOROLAC TROMETHAMINE 30 MG/ML IJ SOLN
30.0000 mg | Freq: Once | INTRAMUSCULAR | Status: AC
  Administered 2021-05-10: 30 mg via INTRAMUSCULAR

## 2021-05-10 MED ORDER — METHYLPREDNISOLONE ACETATE 40 MG/ML IJ SUSP
40.0000 mg | Freq: Once | INTRAMUSCULAR | Status: AC
  Administered 2021-05-10: 40 mg via INTRAMUSCULAR

## 2021-05-10 NOTE — Assessment & Plan Note (Signed)
Patient is having low back pain.  Seems to be more secondary to muscle tightness.  Zanaflex given today.  We will get x-rays to further evaluate for any underlying arthritic changes that could be playing a role as well.  Due to the severity of the pain likely Toradol and Depo-Medrol will be helpful today.  Worsening pain will consider the possibility of prednisone.  If patient has any increasing radicular symptoms or weakness to need to consider advanced imaging.  Follow-up with me again in 4 to 6 weeks

## 2021-05-10 NOTE — Patient Instructions (Addendum)
Xray today Do prescribed exercises at least 3x a week Zanaflex 2mg  at night as needed Consider 3 ibuprofen twice a day for 3 days if continue to have pain See you again in 4-6 weeks

## 2021-05-10 NOTE — Progress Notes (Signed)
Patrick Morse [address] [place] [address] Phone: [phone] Subjective:   Patrick Morse, am serving as a scribe for Dr. Antoine Primas. This visit occurred during the SARS-CoV-2 public health emergency.  Safety protocols were in place, including screening questions prior to the visit, additional usage of staff PPE, and extensive cleaning of exam room while observing appropriate contact time as indicated for disinfecting solutions.    I'm seeing this patient by the request  of:  Joycelyn Rua, MD  CC: Back pain  NUU:[id number]  Patrick Morse is a 55 y.o. male coming in with complaint of back pain. Patient states that he saw another provider for disc issues about 10 years ago. Patient having pain for past 3 weeks. Notices increase in pain with golfing and standing. Pain throughout entire lumbar spine with intermittent pain into the L leg. Has been lying down for pain relief.    MRI of the lumbar spine back in 2014 showed that patient did have progression with foraminal stenosis and mild spinal stenosis at L4-L5 and very mild at L5-S1.  This was independently visualized by me.   Past Medical History:  Diagnosis Date   Anxiety    Bilateral kidney stones    BPH (benign prostatic hyperplasia)    CAD (coronary artery disease), native coronary artery    coronary Ca score 80 with 25-49% mLAD by coronary CTA 04/2020 and normal Lexiscan myoview 05/2020   Depression    Dyspepsia    Epigastric pain    Family hx of colon cancer    High cholesterol    Hx of colonic polyps    Hyperlipidemia    Hypertension    SOB (shortness of breath)    SVT (supraventricular tachycardia) (HCC)    s/p ablation at [facility] Dr. Sampson Goon 2003   Tobacco abuse    Past Surgical History:  Procedure Laterality Date   CARDIAC ELECTROPHYSIOLOGY STUDY AND ABLATION     Social History   Socioeconomic History   Marital status: Married    Spouse name: Not on file   Number  of children: Not on file   Years of education: Not on file   Highest education level: Not on file  Occupational History   Not on file  Tobacco Use   Smoking status: Every Day    Packs/day: 0.50    Types: Cigarettes   Smokeless tobacco: Current  Substance and Sexual Activity   Alcohol use: Yes   Drug use: Yes    Types: Marijuana   Sexual activity: Not on file  Other Topics Concern   Not on file  Social History Narrative   Not on file   Social Determinants of Health   Financial Resource Strain: Not on file  Food Insecurity: Not on file  Transportation Needs: Not on file  Physical Activity: Not on file  Stress: Not on file  Social Connections: Not on file   No Known Allergies No family history on file.   Current Outpatient Medications (Cardiovascular):    atorvastatin (LIPITOR) 20 MG tablet, Take 1 tablet (20 mg total) by mouth daily.   lisinopril (ZESTRIL) 10 MG tablet, Take 10 mg by mouth daily.   metoprolol tartrate (LOPRESSOR) 100 MG tablet, Take 1 tablet (100 mg total) two hours prior to CT scan.   Current Outpatient Medications (Analgesics):    acetaminophen (TYLENOL) 325 MG tablet, Take 650 mg by mouth every 6 (six) hours as needed for pain.   aspirin EC  81 MG tablet, Take 1 tablet (81 mg total) by mouth daily. Swallow whole.   ibuprofen (ADVIL,MOTRIN) 600 MG tablet, Take 1 tablet (600 mg total) by mouth every 6 (six) hours as needed for pain.   Current Outpatient Medications (Other):    tiZANidine (ZANAFLEX) 2 MG tablet, Take 1 tablet (2 mg total) by mouth at bedtime.    Reviewed prior external information including notes and imaging from  primary care provider As well as notes that were available from care everywhere and other healthcare systems.  As stated above.  Past medical history, social, surgical and family history all reviewed in electronic medical record.  No pertanent information unless stated regarding to the chief complaint.   Review of  Systems:  No headache, visual changes, nausea, vomiting, diarrhea, constipation, dizziness, abdominal pain, skin rash, fevers, chills, night sweats, weight loss, swollen lymph nodes, body aches, joint swelling, chest pain, shortness of breath, mood changes. POSITIVE muscle aches  Objective  Blood pressure 120/72, pulse 78, height 5\' 9"  (1.753 m), weight 166 lb (75.3 kg), SpO2 98 %.   General: No apparent distress alert and oriented x3 mood and affect normal, dressed appropriately.  HEENT: Pupils equal, extraocular movements intact  Respiratory: Patient's speak in full sentences and does not appear short of breath  Cardiovascular: No lower extremity edema, non tender, no erythema  Gait normal with good balance and coordination.  MSK: Low back exam loss lordosis.  Tenderness to palpation diffusely in the lumbar spine.  Patient does move very cautiously.  Patient does have some tightness noted with full flexion.  Patient does have significant tightness with test as well.  Negative straight leg test.   Impression and Recommendations:    The above documentation has been reviewed and is accurate and complete [name], DO

## 2021-06-13 NOTE — Progress Notes (Signed)
[name] [address] [place] [address] Phone: [phone] Subjective:   Patrick Morse, am serving as a scribe for Dr. Antoine Primas. This visit occurred during the SARS-CoV-2 public health emergency.  Safety protocols were in place, including screening questions prior to the visit, additional usage of staff PPE, and extensive cleaning of exam room while observing appropriate contact time as indicated for disinfecting solutions.  I'm seeing this patient by the request  of:  Joycelyn Rua, MD  CC: Low back pain follow-up  [id number]  05/10/2021 Patient is having low back pain.  Seems to be more secondary to muscle tightness.  Zanaflex given today.  We will get x-rays to further evaluate for any underlying arthritic changes that could be playing a role as well.  Due to the severity of the pain likely Toradol and Depo-Medrol will be helpful today.  Worsening pain will consider the possibility of prednisone.  If patient has any increasing radicular symptoms or weakness to need to consider advanced imaging.  Follow-up with me again in 4 to 6 weeks  Updated 06/14/2021 Patrick Morse is a 56 y.o. male coming in with complaint of LBP. Patient tried to play golf and his back pain increased. Overall doing better. Stretches have been helping. No pain today.  Patient is happy with the results.  Still has to stop and when he is playing golf and only made at 9 holes last time he played.  Xray IMPRESSION: Mild multilevel degenerative disc disease. No acute abnormality seen.     Past Medical History:  Diagnosis Date   Anxiety    Bilateral kidney stones    BPH (benign prostatic hyperplasia)    CAD (coronary artery disease), native coronary artery    coronary Ca score 80 with 25-49% mLAD by coronary CTA 04/2020 and normal Lexiscan myoview 05/2020   Depression    Dyspepsia    Epigastric pain    Family hx of colon cancer    High cholesterol    Hx of  colonic polyps    Hyperlipidemia    Hypertension    SOB (shortness of breath)    SVT (supraventricular tachycardia) (HCC)    s/p ablation at [facility] Dr. Sampson Goon 2003   Tobacco abuse    Past Surgical History:  Procedure Laterality Date   CARDIAC ELECTROPHYSIOLOGY STUDY AND ABLATION     Social History   Socioeconomic History   Marital status: Married    Spouse name: Not on file   Number of children: Not on file   Years of education: Not on file   Highest education level: Not on file  Occupational History   Not on file  Tobacco Use   Smoking status: Every Day    Packs/day: 0.50    Types: Cigarettes   Smokeless tobacco: Current  Substance and Sexual Activity   Alcohol use: Yes   Drug use: Yes    Types: Marijuana   Sexual activity: Not on file  Other Topics Concern   Not on file  Social History Narrative   Not on file   Social Determinants of Health   Financial Resource Strain: Not on file  Food Insecurity: Not on file  Transportation Needs: Not on file  Physical Activity: Not on file  Stress: Not on file  Social Connections: Not on file   No Known Allergies No family history on file.   Current Outpatient Medications (Cardiovascular):    atorvastatin (LIPITOR) 20 MG tablet, Take 1 tablet (  20 mg total) by mouth daily.   lisinopril (ZESTRIL) 10 MG tablet, Take 10 mg by mouth daily.   metoprolol tartrate (LOPRESSOR) 100 MG tablet, Take 1 tablet (100 mg total) two hours prior to CT scan.   Current Outpatient Medications (Analgesics):    acetaminophen (TYLENOL) 325 MG tablet, Take 650 mg by mouth every 6 (six) hours as needed for pain.   aspirin EC 81 MG tablet, Take 1 tablet (81 mg total) by mouth daily. Swallow whole.   ibuprofen (ADVIL,MOTRIN) 600 MG tablet, Take 1 tablet (600 mg total) by mouth every 6 (six) hours as needed for pain.   Current Outpatient Medications (Other):    tiZANidine (ZANAFLEX) 2 MG tablet, Take 1 tablet (2 mg total) by mouth at  bedtime.    Objective  Blood pressure 120/80, pulse 85, height 5\' 9"  (1.753 m), weight 165 lb (74.8 kg), SpO2 98 %.   General: No apparent distress alert and oriented x3 mood and affect normal, dressed appropriately.  HEENT: Pupils equal, extraocular movements intact  Respiratory: Patient's speak in full sentences and does not appear short of breath  Cardiovascular: No lower extremity edema, non tender, no erythema  Gait normal with good balance and coordination.  MSK: Low back exam does have some very mild loss of lordosis.  Sitting comfortably in the chair.  Patient able to go from a laying to sitting position with no pain and going from a sitting to standing position with no pain    Impression and Recommendations:     The above documentation has been reviewed and is accurate and complete , DO

## 2021-06-14 ENCOUNTER — Encounter: Payer: Self-pay | Admitting: Family Medicine

## 2021-06-14 ENCOUNTER — Other Ambulatory Visit: Payer: Self-pay

## 2021-06-14 ENCOUNTER — Ambulatory Visit (INDEPENDENT_AMBULATORY_CARE_PROVIDER_SITE_OTHER): Payer: 59 | Admitting: Family Medicine

## 2021-06-14 DIAGNOSIS — G8929 Other chronic pain: Secondary | ICD-10-CM

## 2021-06-14 DIAGNOSIS — M545 Low back pain, unspecified: Secondary | ICD-10-CM | POA: Diagnosis not present

## 2021-06-14 NOTE — Assessment & Plan Note (Signed)
Low back exam does have some mild loss of lordosis still but is made significant improvement already.  Does feel the 2 injections seem to be the most beneficial.  Discussed with patient about different icing regimen, continuing to stay active.  Follow-up with me again in 6 to 8 weeks.

## 2021-06-14 NOTE — Patient Instructions (Signed)
Great to see you Glad you are better Don't change anything See me in 2 months

## 2021-08-19 ENCOUNTER — Ambulatory Visit: Payer: 59 | Admitting: Family Medicine

## 2022-02-07 ENCOUNTER — Telehealth: Payer: Self-pay | Admitting: Cardiology

## 2022-02-07 NOTE — Telephone Encounter (Signed)
Pt c/o Shortness Of Breath: STAT if SOB developed within the last 24 hours or pt is noticeably SOB on the phone  1. Are you currently SOB (can you hear that pt is SOB on the phone)? No  2. How long have you been experiencing SOB?  6 months  3. Are you SOB when sitting or when up moving around? Both  4. Are you currently experiencing any other symptoms? Fatigue

## 2022-02-07 NOTE — Telephone Encounter (Signed)
Spoke with the patient who states that he has been having shortness of breath and fatigue for over 6 months now. He states recently he has started to feel a bit worse and has been concerned. He reports that he has been anxious as well. He reports shortness of breath at rest and a couple episodes of slight discomfort in his chest. He has had some dizzy spells as well. Patient is overdue for a follow up and was scheduled for a visit with Robin Searing, NP. He will call back if anything worsens prior to appointment.

## 2022-02-19 NOTE — Progress Notes (Unsigned)
Office Visit    Patient Name: Patrick Morse Date of Encounter: 02/19/2022  Primary Care Provider:  Joycelyn Rua, MD Primary Cardiologist:  Armanda Magic, MD Primary Electrophysiologist: None  Chief Complaint    Patrick Morse is a 56 y.o. male with PMH of SVT s/p ablation 2003, tobacco abuse, HTN, HLD, anxiety who presents today for 1 year follow-up of HTN.  Past Medical History    Past Medical History:  Diagnosis Date   Anxiety    Bilateral kidney stones    BPH (benign prostatic hyperplasia)    CAD (coronary artery disease), native coronary artery    coronary Ca score 80 with 25-49% mLAD by coronary CTA 04/2020 and normal Lexiscan myoview 05/2020   Depression    Dyspepsia    Epigastric pain    Family hx of colon cancer    High cholesterol    Hx of colonic polyps    Hyperlipidemia    Hypertension    SOB (shortness of breath)    SVT (supraventricular tachycardia) (HCC)    s/p ablation at [facility] Dr. Sampson Goon 2003   Tobacco abuse    Past Surgical History:  Procedure Laterality Date   CARDIAC ELECTROPHYSIOLOGY STUDY AND ABLATION      Allergies  No Known Allergies  History of Present Illness    Patrick Morse  is a 56 year old male with the above mention past medical history who presents today for follow-up of hypertension.  Patrick Morse was first seen by Dr. Mayford Knife in 2021 for dyspnea on exertion following referral from PCP.  2D echo was ordered but not completed.  ZIO monitor was placed and stated that showed sinus rhythm with rare premature beats.  Coronary CTA was also completed and demonstrated no significant stenosis in coronaries with no significant aortic calcifications noted and calcium score was 0. Lexiscan Myoview was also completed that was normal.  Patrick Morse presents today for follow-up alone.  Since last being seen in the office patient reports that he is been doing better from a cardiac standpoint but does still endorse shortness of breath  and dizziness.  He states that this occurs with and without activity.  He also reports increased life stressors as he is a single parent to 2 sons.  He maintains physical activity with weightlifting and golf.  Since his last visit he discontinued his atorvastatin and baby aspirin due to side effects.  He states that he felt some soreness following his atorvastatin.  We discussed the alternatives and need to possible trial another statin to evaluate for possible myalgias.  He is in agreement to proceed with this plan at this time.  Patient denies chest pain, palpitations, dyspnea, PND, orthopnea, nausea, vomiting, dizziness, syncope, edema, weight gain, or early satiety.  Home Medications    Current Outpatient Medications  Medication Sig Dispense Refill   acetaminophen (TYLENOL) 325 MG tablet Take 650 mg by mouth every 6 (six) hours as needed for pain.     aspirin EC 81 MG tablet Take 1 tablet (81 mg total) by mouth daily. Swallow whole. 90 tablet 3   atorvastatin (LIPITOR) 20 MG tablet Take 1 tablet (20 mg total) by mouth daily. 90 tablet 3   ibuprofen (ADVIL,MOTRIN) 600 MG tablet Take 1 tablet (600 mg total) by mouth every 6 (six) hours as needed for pain. 30 tablet 0   lisinopril (ZESTRIL) 10 MG tablet Take 10 mg by mouth daily.     metoprolol tartrate (LOPRESSOR) 100 MG  tablet Take 1 tablet (100 mg total) two hours prior to CT scan. 1 tablet 0   tiZANidine (ZANAFLEX) 2 MG tablet Take 1 tablet (2 mg total) by mouth at bedtime. 30 tablet 0   No current facility-administered medications for this visit.     Review of Systems  Please see the history of present illness.    (+) Fatigue (+) Shortness of breath  All other systems reviewed and are otherwise negative except as noted above.  Physical Exam    Wt Readings from Last 3 Encounters:  06/14/21 165 lb (74.8 kg)  05/10/21 166 lb (75.3 kg)  06/07/20 159 lb (72.1 kg)   IR:WERXV were no vitals filed for this visit.,There is no height or  weight on file to calculate BMI.  Constitutional:      Appearance: Healthy appearance. Not in distress.  Neck:     Vascular: JVD normal.  Pulmonary:     Effort: Pulmonary effort is normal.     Breath sounds: No wheezing. No rales. Diminished in the bases Cardiovascular:     Normal rate. Regular rhythm. Normal S1. Normal S2.      Murmurs: There is no murmur.  Edema:    Peripheral edema absent.  Abdominal:     Palpations: Abdomen is soft non tender. There is no hepatomegaly.  Skin:    General: Skin is warm and dry.  Neurological:     General: No focal deficit present.     Mental Status: Alert and oriented to person, place and time.     Cranial Nerves: Cranial nerves are intact.  EKG/LABS/Other Studies Reviewed    ECG personally reviewed by me today -sinus bradycardia with rate of 59 bpm with normal axis and no acute changes noted.      Lab Results  Component Value Date   WBC 10.7 (H) 12/15/2011   HGB 15.1 12/15/2011   HCT 42.5 12/15/2011   MCV 88.9 12/15/2011   PLT 279 12/15/2011   Lab Results  Component Value Date   CREATININE 1.00 12/15/2011   BUN 14 12/15/2011   NA 135 12/15/2011   K 4.4 12/15/2011   CL 100 12/15/2011   CO2 26 12/15/2011   Lab Results  Component Value Date   ALT 26 07/22/2020   Lab Results  Component Value Date   CHOL 139 07/22/2020   HDL 46 07/22/2020   LDLCALC 82 07/22/2020   TRIG 52 07/22/2020   CHOLHDL 3.0 07/22/2020    No results found for: "HGBA1C"  Assessment & Plan    1.  Nonobstructive coronary artery disease: -Coronary CTA completed 05/2020 showing calcium score of 0 with no significant coronary stenosis -Patient reports today no chest discomfort but does endorse fatigue and shortness of breath with heavy exertion -Patient had discontinued atorvastatin and 81 mg ASA since previous visit due to possible myalgias. -We discussed the importance of GDMT in regard to atherosclerosis and smoking. -We will try Crestor 5 mg daily and  restart ASA 81 mg daily LFTs and lipids in 6 to 8 weeks  2.  Hypertension: -Patient's blood pressure today was well controlled at 132/68 -Continue Zestril 10 mg daily -Patient's most recent creatinine was 0.98 and BUN of 17  3.  Supraventricular tachycardia:  -Today patient notes occasional PACs that are asymptomatic and not bothersome. -ZIO monitor worn in the past revealed sinus rhythm with isolated PACs and PVCs -EKG completed today with sinus bradycardia and no PACs or PVCs noted  4.  Near syncope: -Patient  reports today no syncope but does report dizziness and increased fatigue. -Reports the symptoms with and without exertion and have been ongoing since previous visit. -2D echo to evaluate heart structure and valve function  5.  Tobacco abuse: -Patient states he is smoking 1 pack/day currently and is motivated to stop at this time. -He was advised to contact [phone] quit now and I encouraged him to also discuss medication alternatives with his PCP.   Disposition: Follow-up with Armanda Magic, MD or APP in 2 months    Medication Adjustments/Labs and Tests Ordered: Current medicines are reviewed at length with the patient today.  Concerns regarding medicines are outlined above.   Signed, Napoleon Form, Leodis Rains, NP 02/19/2022, 2:36 PM [facility]  Note:  This document was prepared using Dragon voice recognition software and may include unintentional dictation errors.

## 2022-02-21 ENCOUNTER — Encounter: Payer: Self-pay | Admitting: Nurse Practitioner

## 2022-02-21 ENCOUNTER — Ambulatory Visit: Payer: 59 | Attending: Nurse Practitioner | Admitting: Nurse Practitioner

## 2022-02-21 VITALS — BP 132/68 | HR 59 | Ht 69.0 in | Wt 161.0 lb

## 2022-02-21 DIAGNOSIS — R002 Palpitations: Secondary | ICD-10-CM | POA: Diagnosis not present

## 2022-02-21 DIAGNOSIS — I10 Essential (primary) hypertension: Secondary | ICD-10-CM

## 2022-02-21 DIAGNOSIS — Z720 Tobacco use: Secondary | ICD-10-CM

## 2022-02-21 DIAGNOSIS — R42 Dizziness and giddiness: Secondary | ICD-10-CM

## 2022-02-21 DIAGNOSIS — R0609 Other forms of dyspnea: Secondary | ICD-10-CM

## 2022-02-21 DIAGNOSIS — I2510 Atherosclerotic heart disease of native coronary artery without angina pectoris: Secondary | ICD-10-CM | POA: Diagnosis not present

## 2022-02-21 DIAGNOSIS — R5383 Other fatigue: Secondary | ICD-10-CM

## 2022-02-21 DIAGNOSIS — I2583 Coronary atherosclerosis due to lipid rich plaque: Secondary | ICD-10-CM

## 2022-02-21 MED ORDER — ROSUVASTATIN CALCIUM 5 MG PO TABS: 5.0000 mg | ORAL_TABLET | Freq: Every day | ORAL | 3 refills | Status: DC

## 2022-02-21 MED ORDER — ASPIRIN 81 MG PO TBEC: 81.0000 mg | DELAYED_RELEASE_TABLET | Freq: Every day | ORAL | 3 refills | Status: AC

## 2022-02-21 NOTE — Patient Instructions (Addendum)
Medication Instructions:  Your physician has recommended you make the following change in your medication:   START CRESTOR 5 mg by mouth daily START ASPIRIN 81 mg by mouth daily  *If you need a refill on your cardiac medications before your next appointment, please call your pharmacy*  Lab Work: Your physician recommends that you return for lab work in: 8 weeks for fasting lipid and liver panel  If you have labs (blood work) drawn today and your tests are completely normal, you will receive your results only by: [place] (if you have MyChart) OR A paper copy in the mail If you have any lab test that is abnormal or we need to change your treatment, we will call you to review the results.   Testing/Procedures: Your physician has requested that you have an echocardiogram. Echocardiography is a painless test that uses sound waves to create images of your heart. It provides your doctor with information about the size and shape of your heart and how well your heart's chambers and valves are working. This procedure takes approximately one hour. There are no restrictions for this procedure.  Follow-Up: At [facility], you and your health needs are our priority.  As part of our continuing mission to provide you with exceptional heart care, we have created designated Provider Care Teams.  These Care Teams include your primary Cardiologist (physician) and Advanced Practice Providers (APPs -  Physician Assistants and Nurse Practitioners) who all work together to provide you with the care you need, when you need it.  We recommend signing up for the patient portal called "MyChart".  Sign up information is provided on this After Visit Summary.  MyChart is used to connect with patients for Virtual Visits (Telemedicine).  Patients are able to view lab/test results, encounter notes, upcoming appointments, etc.  Non-urgent messages can be sent to your provider as well.   To learn more about  what you can do with MyChart, go to [email or web address].    Your next appointment:   2 month(s)  The format for your next appointment:   In Person  Provider:   Ambrose Pancoast, NP       Other Instructions Call the [employer]'s Smoking Quitline: 1-800-QUIT-NOW 539-711-1734) Text QUIT to SmokefreeTXT: 628315  Important Information About Sugar

## 2022-02-22 ENCOUNTER — Telehealth: Payer: Self-pay | Admitting: Cardiology

## 2022-02-22 MED ORDER — ROSUVASTATIN CALCIUM 5 MG PO TABS: 5.0000 mg | ORAL_TABLET | Freq: Every day | ORAL | 3 refills | Status: DC

## 2022-02-22 NOTE — Telephone Encounter (Signed)
Pt c/o medication issue:  1. Name of Medication:  rosuvastatin (CRESTOR) 5 MG tablet  2. How are you currently taking this medication (dosage and times per day)?   3. Are you having a reaction (difficulty breathing--STAT)?   4. What is your medication issue?   [facility] is not in network with patient's insurance and medication will not be covered. He is requesting to have current Rx transferred to [facility] if possible. Please assist.

## 2022-02-22 NOTE — Addendum Note (Signed)
Addended by: Thora Lance on: 02/22/2022 02:41 PM   Modules accepted: Orders

## 2022-02-22 NOTE — Telephone Encounter (Signed)
Spoke with pt and advised Rx sent to pharmacy as requested.  Pt verbalizes understanding and thanked Therapist, sports for the call.

## 2022-03-13 ENCOUNTER — Ambulatory Visit (HOSPITAL_COMMUNITY): Payer: 59 | Attending: Nurse Practitioner

## 2022-03-13 DIAGNOSIS — R5383 Other fatigue: Secondary | ICD-10-CM | POA: Diagnosis present

## 2022-03-13 DIAGNOSIS — I2583 Coronary atherosclerosis due to lipid rich plaque: Secondary | ICD-10-CM | POA: Diagnosis present

## 2022-03-13 DIAGNOSIS — R002 Palpitations: Secondary | ICD-10-CM | POA: Insufficient documentation

## 2022-03-13 DIAGNOSIS — R42 Dizziness and giddiness: Secondary | ICD-10-CM | POA: Insufficient documentation

## 2022-03-13 DIAGNOSIS — R0609 Other forms of dyspnea: Secondary | ICD-10-CM | POA: Diagnosis not present

## 2022-03-13 DIAGNOSIS — I2510 Atherosclerotic heart disease of native coronary artery without angina pectoris: Secondary | ICD-10-CM | POA: Insufficient documentation

## 2022-03-13 LAB — ECHOCARDIOGRAM COMPLETE
Area-P 1/2: 2.09 cm2
S' Lateral: 2.9 cm

## 2022-04-17 ENCOUNTER — Ambulatory Visit: Payer: 59 | Attending: Nurse Practitioner

## 2022-04-17 DIAGNOSIS — I2510 Atherosclerotic heart disease of native coronary artery without angina pectoris: Secondary | ICD-10-CM

## 2022-04-17 DIAGNOSIS — R0609 Other forms of dyspnea: Secondary | ICD-10-CM

## 2022-04-17 DIAGNOSIS — R42 Dizziness and giddiness: Secondary | ICD-10-CM

## 2022-04-18 LAB — HEPATIC FUNCTION PANEL
ALT: 20 IU/L (ref 0–44)
AST: 20 IU/L (ref 0–40)
Albumin: 4.8 g/dL (ref 3.8–4.9)
Alkaline Phosphatase: 57 IU/L (ref 44–121)
Bilirubin Total: 0.6 mg/dL (ref 0.0–1.2)
Bilirubin, Direct: 0.13 mg/dL (ref 0.00–0.40)
Total Protein: 7.4 g/dL (ref 6.0–8.5)

## 2022-04-18 LAB — LIPID PANEL
Chol/HDL Ratio: 3 ratio (ref 0.0–5.0)
Cholesterol, Total: 143 mg/dL (ref 100–199)
HDL: 48 mg/dL (ref >39)
LDL Chol Calc (NIH): 82 mg/dL (ref 0–99)
Triglycerides: 63 mg/dL (ref 0–149)
VLDL Cholesterol Cal: 13 mg/dL (ref 5–40)

## 2022-04-23 NOTE — Progress Notes (Signed)
Office Visit    Patient Name: Patrick Morse Date of Encounter: 04/24/2022  Primary Care Provider:  Joycelyn Rua, MD Primary Cardiologist:  Armanda Magic, MD Primary Electrophysiologist: None  Chief Complaint    Patrick Morse is a 56 y.o. male with PMH of SVT s/p ablation 2003, tobacco abuse, HTN, HLD, anxiety who presents today for 14-month follow-up of hypertension.  Past Medical History    Past Medical History:  Diagnosis Date   Anxiety    Bilateral kidney stones    BPH (benign prostatic hyperplasia)    CAD (coronary artery disease), native coronary artery    coronary Ca score 80 with 25-49% mLAD by coronary CTA 04/2020 and normal Lexiscan myoview 05/2020   Depression    Dyspepsia    Epigastric pain    Family hx of colon cancer    High cholesterol    Hx of colonic polyps    Hyperlipidemia    Hypertension    SOB (shortness of breath)    SVT (supraventricular tachycardia)    s/p ablation at [facility] Dr. Sampson Goon 2003   Tobacco abuse    Past Surgical History:  Procedure Laterality Date   CARDIAC ELECTROPHYSIOLOGY STUDY AND ABLATION      Allergies  Morse Known Allergies  History of Present Illness    Patrick Morse  is a 56 year old male with the above mention past medical history who presents today for follow-up of hypertension.  Mr. Maciolek was first seen by Dr. Mayford Knife in 2021 for dyspnea on exertion following referral from PCP.  2D echo was ordered but not completed.  ZIO monitor was placed and stated that showed sinus rhythm with rare premature beats.  Coronary CTA was also completed and demonstrated Morse significant stenosis in coronaries with Morse significant aortic calcifications noted and calcium score was 0. Lexiscan Myoview was also completed that was normal.  He was seen in follow-up on 02/21/2022 and reported doing well from a cardiac standpoint.  He did endorse some occasional shortness of breath and dizziness.  He discontinued his atorvastatin due to  possible myalgias.  We discussed challenging his atorvastatin again for myalgias and he was in agreement.  Mr. Meriwether presents today for 45-month follow-up alone.  Since last being seen in the office patient reports he has been doing well with Morse cardiac complaints or increased shortness of breath.  He is currently on Crestor and reports Morse myalgias or he is currently not exercising has continued to smoke since his previous visit.  He is compliant with his ASA 81 mg, Zestril 10 mg and is euvolemic on examination.  Today we discussed the importance of primary prevention and controlling progression of cardiovascular disease.  He reports a willingness to stop smoking and to increase his physical activity at this time.  Patient denies chest pain, palpitations, dyspnea, PND, orthopnea, nausea, vomiting, dizziness, syncope, edema, weight gain, or early satiety.  Home Medications    Current Outpatient Medications  Medication Sig Dispense Refill   acetaminophen (TYLENOL) 325 MG tablet Take 650 mg by mouth every 6 (six) hours as needed for pain.     aspirin EC 81 MG tablet Take 1 tablet (81 mg total) by mouth daily. Swallow whole. 90 tablet 3   lisinopril (ZESTRIL) 10 MG tablet Take 10 mg by mouth daily.     rosuvastatin (CRESTOR) 5 MG tablet Take 1 tablet (5 mg total) by mouth daily. 90 tablet 3   Morse current facility-administered medications for this  visit.     Review of Systems  Please see the history of present illness.    (+) Shortness of breath with and without exertion All other systems reviewed and are otherwise negative except as noted above.  Physical Exam    Wt Readings from Last 3 Encounters:  04/24/22 171 lb (77.6 kg)  02/21/22 161 lb (73 kg)  06/14/21 165 lb (74.8 kg)   VS: Vitals:   04/24/22 0920  BP: 114/80  Pulse: 68  SpO2: 97%  ,Body mass index is 25.25 kg/m.  Constitutional:      Appearance: Healthy appearance. Not in distress.  Neck:     Vascular: JVD normal.   Pulmonary:     Effort: Pulmonary effort is normal.     Breath sounds: Morse wheezing. Morse rales. Diminished in the bases Cardiovascular:     Normal rate. Regular rhythm. Normal S1. Normal S2.      Murmurs: There is Morse murmur.  Edema:    Peripheral edema absent.  Abdominal:     Palpations: Abdomen is soft non tender. There is Morse hepatomegaly.  Skin:    General: Skin is warm and dry.  Neurological:     General: Morse focal deficit present.     Mental Status: Alert and oriented to person, place and time.     Cranial Nerves: Cranial nerves are intact.  EKG/LABS/Other Studies Reviewed    ECG personally reviewed by me today -none completed today   Lab Results  Component Value Date   WBC 10.7 (H) 12/15/2011   HGB 15.1 12/15/2011   HCT 42.5 12/15/2011   MCV 88.9 12/15/2011   PLT 279 12/15/2011   Lab Results  Component Value Date   CREATININE 1.00 12/15/2011   BUN 14 12/15/2011   NA 135 12/15/2011   K 4.4 12/15/2011   CL 100 12/15/2011   CO2 26 12/15/2011   Lab Results  Component Value Date   ALT 20 04/17/2022   AST 20 04/17/2022   ALKPHOS 57 04/17/2022   BILITOT 0.6 04/17/2022   Lab Results  Component Value Date   CHOL 143 04/17/2022   HDL 48 04/17/2022   LDLCALC 82 04/17/2022   TRIG 63 04/17/2022   CHOLHDL 3.0 04/17/2022    Morse results found for: "HGBA1C"  Assessment & Plan    1.  Nonobstructive coronary artery disease: -Coronary CTA completed 05/2020 showing calcium score of 0 with Morse significant coronary stenosis -Reports Morse chest pain or anginal equivalent. -We discussed the importance of GDMT in regard to atherosclerosis and smoking. -He is currently not dissipating in physical activity and was encouraged to participate in 150 minutes/week of aerobic activity.    2.  Hypertension: -Patient's blood pressure today was well controlled at 114/80 -Continue Zestril 10 mg daily -Patient's most recent creatinine was 0.98 and BUN of 17   3.  Supraventricular  tachycardia:  -Today patient reports Morse palpitations or tachyarrhythmia since previous visit   4.  Mild MR regurgitation: -Patient reports today Morse syncope but does report dizziness and increased fatigue. -Previous 2D echo completed with Morse progression of mitral valve regurgitation noted. -Continue good blood pressure control   5.  Tobacco abuse: -Patient states he is smoking 1 pack/day currently and is motivated to stop at this time. -He was advised to contact 1 [phone] quit now and I encouraged him to also discuss medication alternatives with his PCP.  6.  Hyperlipidemia: -Currently reports Morse myalgias with current Crestor dose. -Previous LDL 86 not at  goal of less than 70 -We will increase Crestor to 10 mg daily -Recheck LFTs and lipids in 8 weeks     Disposition: Follow-up with Armanda Magic, MD or APP in 12 months    Medication Adjustments/Labs and Tests Ordered: Current medicines are reviewed at length with the patient today.  Concerns regarding medicines are outlined above.   Signed, Napoleon Form, Leodis Rains, NP 04/24/2022, 9:51 AM [facility]  Note:  This document was prepared using Dragon voice recognition software and may include unintentional dictation errors.

## 2022-04-24 ENCOUNTER — Encounter: Payer: Self-pay | Admitting: Nurse Practitioner

## 2022-04-24 ENCOUNTER — Ambulatory Visit: Payer: 59 | Attending: Nurse Practitioner | Admitting: Nurse Practitioner

## 2022-04-24 VITALS — BP 114/80 | HR 68 | Ht 69.0 in | Wt 171.0 lb

## 2022-04-24 DIAGNOSIS — I10 Essential (primary) hypertension: Secondary | ICD-10-CM | POA: Diagnosis not present

## 2022-04-24 DIAGNOSIS — I340 Nonrheumatic mitral (valve) insufficiency: Secondary | ICD-10-CM

## 2022-04-24 DIAGNOSIS — Z720 Tobacco use: Secondary | ICD-10-CM | POA: Diagnosis not present

## 2022-04-24 DIAGNOSIS — I2510 Atherosclerotic heart disease of native coronary artery without angina pectoris: Secondary | ICD-10-CM

## 2022-04-24 DIAGNOSIS — I2583 Coronary atherosclerosis due to lipid rich plaque: Secondary | ICD-10-CM

## 2022-04-24 DIAGNOSIS — E785 Hyperlipidemia, unspecified: Secondary | ICD-10-CM

## 2022-04-24 DIAGNOSIS — I471 Supraventricular tachycardia, unspecified: Secondary | ICD-10-CM | POA: Diagnosis not present

## 2022-04-24 MED ORDER — ROSUVASTATIN CALCIUM 10 MG PO TABS: 10.0000 mg | ORAL_TABLET | Freq: Every day | ORAL | 3 refills | Status: DC

## 2022-04-24 NOTE — Patient Instructions (Addendum)
Medication Instructions:  INCREASE Crestor to 10mg  Take 1 tablet daily *If you need a refill on your cardiac medications before your next appointment, please call your pharmacy*   Lab Work: 8 WEEKS FASTING- LIPIDS & LFT If you have labs (blood work) drawn today and your tests are completely normal, you will receive your results only by: MyChart Message (if you have MyChart) OR A paper copy in the mail If you have any lab test that is abnormal or we need to change your treatment, we will call you to review the results.   Testing/Procedures: NONE ORDERED   Follow-Up: At [facility], you and your health needs are our priority.  As part of our continuing mission to provide you with exceptional heart care, we have created designated Provider Care Teams.  These Care Teams include your primary Cardiologist (physician) and Advanced Practice Providers (APPs -  Physician Assistants and Nurse Practitioners) who all work together to provide you with the care you need, when you need it.  We recommend signing up for the patient portal called "MyChart".  Sign up information is provided on this After Visit Summary.  MyChart is used to connect with patients for Virtual Visits (Telemedicine).  Patients are able to view lab/test results, encounter notes, upcoming appointments, etc.  Non-urgent messages can be sent to your provider as well.   To learn more about what you can do with MyChart, go to [facility].    Your next appointment:   12 month(s)  The format for your next appointment:   In Person  Provider:   [email or web address], MD  or Armanda Magic, NP   Other Instructions 1-800-Quit-Now (to help with smoking)  Important Information About Sugar

## 2022-05-09 ENCOUNTER — Telehealth: Payer: Self-pay | Admitting: Nurse Practitioner

## 2022-05-09 NOTE — Telephone Encounter (Signed)
Pt c/o Shortness Of Breath: STAT if SOB developed within the last 24 hours or pt is noticeably SOB on the phone  1. Are you currently SOB (can you hear that pt is SOB on the phone)? Both   2. How long have you been experiencing SOB? Last 6 months   3. Are you SOB when sitting or when up moving around? Both   4. Are you currently experiencing any other symptoms? Fatigue, and a little chest discomfort.  Pt states he would to speak with Alden Server, NP if possible. He states he has been very fatigue and having trouble getting going. Please advise.

## 2022-05-09 NOTE — Telephone Encounter (Signed)
Pt called to talk with Robin Searing NP... he says he has continued chest aching and some SOB with minimal exertion but mostly laying down flat at night... he wanted go over his Echo from 02/2022 again we talked about it for several minutes he was worried about his MER and wanted to be sure that he did not have any Cardiomyopathy. He says he is not feeling any worse than when he saw Alden Server but just wish he felt better and had some improvement.   I will forward to Alden Server for his review.

## 2022-05-10 NOTE — Telephone Encounter (Signed)
Contacted the patient and offered him an appointment for today; the patient declined. The patient wanted an appointment for tomorrow in the morning but we do not have any openings. Patient agreeable to appointment on Monday a 8:50am. Patient voiced understanding.

## 2022-05-14 NOTE — Progress Notes (Unsigned)
Office Visit    Patient Name: Patrick Morse Date of Encounter: 05/15/2022  Primary Care Provider:  Joycelyn Rua, MD Primary Cardiologist:  Armanda Magic, MD Primary Electrophysiologist: None  Chief Complaint    Patrick Morse is a 56 y.o. male with PMH of SVT s/p ablation 2003, tobacco abuse, HTN, HLD, anxiety who presents today for complaint of ongoing shortness of breath with minimal exertion and chest tightness when lying down.  Past Medical History    Past Medical History:  Diagnosis Date   Anxiety    Bilateral kidney stones    BPH (benign prostatic hyperplasia)    CAD (coronary artery disease), native coronary artery    coronary Ca score 80 with 25-49% mLAD by coronary CTA 04/2020 and normal Lexiscan myoview 05/2020   Depression    Dyspepsia    Epigastric pain    Family hx of colon cancer    High cholesterol    Hx of colonic polyps    Hyperlipidemia    Hypertension    SOB (shortness of breath)    SVT (supraventricular tachycardia)    s/p ablation at [facility] Dr. Sampson Goon 2003   Tobacco abuse    Past Surgical History:  Procedure Laterality Date   CARDIAC ELECTROPHYSIOLOGY STUDY AND ABLATION      Allergies  No Known Allergies  History of Present Illness     Patrick Morse  is a 56 year old male with the above mention past medical history who presents today for follow-up of hypertension.  Patrick Morse was first seen by Dr. Mayford Knife in 2021 for dyspnea on exertion following referral from PCP.  2D echo was ordered but not completed.  ZIO monitor was placed and stated that showed sinus rhythm with rare premature beats.  Coronary CTA was also completed and demonstrated no significant stenosis in coronaries with no significant aortic calcifications noted and calcium score was 0. Lexiscan Myoview was also completed that was normal.  He was seen in follow-up on 02/21/2022 and reported doing well from a cardiac standpoint.  He did endorse some occasional shortness  of breath and dizziness.  He discontinued his atorvastatin due to possible myalgias.  We discussed challenging his atorvastatin again for myalgias and he was in agreement.  He presented for follow-up 04/24/2022 and reported doing well with no cardiac complaints or increased shortness of breath.  He also endorsed no myalgias while on Crestor.  He continues to not exercise and is currently smoking.  He does endorse a willingness to stop smoking and was provided 1 800 quit now for support.  He contacted our office on 05/09/2022 with ongoing shortness of breath with minimal exertion and chest discomfort.  Patrick Morse presents today for complaint of fatigue and chest discomfort.  Since last being seen in the office patient reports that he is experiencing increased fatigue with minimal exertion and chest discomfort that he describes as pressure substernally.  He was free of this symptom today during his visit.  His blood pressure today was 120/78 and heart rate was 61 bpm. He is currently tolerating his medications without any adverse reactions.  He reports that he has felt more anxiety and depression over the past 4 years.  He is a single father and was divorced over 6 years ago and has had a difficult time adjusting with his situation.  During our visit we reviewed the results of his previous 2D echo and cardiac CTA results.  He was concerned about having a possible  cardiomyopathy and we discussed at length the pathophysiology behind these cardiac disorders.  Patient denies chest pain, palpitations, dyspnea, PND, orthopnea, nausea, vomiting, dizziness, syncope, edema, weight gain, or early satiety.  Home Medications    Current Outpatient Medications  Medication Sig Dispense Refill   acetaminophen (TYLENOL) 325 MG tablet Take 650 mg by mouth every 6 (six) hours as needed for pain.     aspirin EC 81 MG tablet Take 1 tablet (81 mg total) by mouth daily. Swallow whole. 90 tablet 3   lisinopril (ZESTRIL) 10 MG  tablet Take 10 mg by mouth daily.     rosuvastatin (CRESTOR) 10 MG tablet Take 1 tablet (10 mg total) by mouth daily. 90 tablet 3   No current facility-administered medications for this visit.     Review of Systems  Please see the history of present illness.    (+) Decreased endurance and anxiety (+) Shortness of breath and substernal chest pressure  All other systems reviewed and are otherwise negative except as noted above.  Physical Exam    Wt Readings from Last 3 Encounters:  05/15/22 166 lb (75.3 kg)  04/24/22 171 lb (77.6 kg)  02/21/22 161 lb (73 kg)   VS: Vitals:   05/15/22 0902  BP: 120/78  Pulse: 61  SpO2: 99%  ,Body mass index is 24.87 kg/m.  Constitutional:      Appearance: Healthy appearance. Not in distress.  Neck:     Vascular: JVD normal.  Pulmonary:     Effort: Pulmonary effort is normal.     Breath sounds: No wheezing. No rales. Diminished in the bases Cardiovascular:     Normal rate. Regular rhythm. Normal S1. Normal S2.      Murmurs: There is no murmur.  Edema:    Peripheral edema absent.  Abdominal:     Palpations: Abdomen is soft non tender. There is no hepatomegaly.  Skin:    General: Skin is warm and dry.  Neurological:     General: No focal deficit present.     Mental Status: Alert and oriented to person, place and time.     Cranial Nerves: Cranial nerves are intact.  EKG/LABS/Other Studies Reviewed    ECG personally reviewed by me today -sinus rhythm with rate of 61 bpm and no acute changes consistent with previous EKG.  Lab Results  Component Value Date   WBC 10.7 (H) 12/15/2011   HGB 15.1 12/15/2011   HCT 42.5 12/15/2011   MCV 88.9 12/15/2011   PLT 279 12/15/2011   Lab Results  Component Value Date   CREATININE 1.00 12/15/2011   BUN 14 12/15/2011   NA 135 12/15/2011   K 4.4 12/15/2011   CL 100 12/15/2011   CO2 26 12/15/2011   Lab Results  Component Value Date   ALT 20 04/17/2022   AST 20 04/17/2022   ALKPHOS 57  04/17/2022   BILITOT 0.6 04/17/2022   Lab Results  Component Value Date   CHOL 143 04/17/2022   HDL 48 04/17/2022   LDLCALC 82 04/17/2022   TRIG 63 04/17/2022   CHOLHDL 3.0 04/17/2022    No results found for: "HGBA1C"  Assessment & Plan    1.  Nonobstructive coronary artery disease: -Patient reports that he has experienced substernal chest discomfort and increased fatigue over the past few months. -He denies any dizziness or palpitations with this discomfort and completed a previous Lexiscan Myoview that showed no evidence of ischemia.  He did also have a cardiac CTA that showed  normal blood flow but possible moderate CAD stenosis. -We will order a cardiac PET stress test study to evaluate for possible LAD blockage. -Continue GDMT with ASA 81 mg and Crestor 10 mg daily - 2.  Hypertension: -Patient's blood pressure today was well controlled at 120/80 -Continue Zestril 10 mg daily -Patient's most recent creatinine was 0.98 and BUN of 17   3.  Supraventricular tachycardia:  -Today patient reports no palpitations or tachyarrhythmia since previous visit   4.  Mild MR regurgitation: -Patient reports today no syncope but does report dizziness and increased fatigue. -Previous 2D echo completed with no progression of mitral valve regurgitation noted. -Continue good blood pressure control   5.  Tobacco abuse: -Patient states he is smoking 1 pack/day currently and is motivated to stop at this time. -He was advised to contact 1 800 quit now and I encouraged him to also discuss medication alternatives with his PCP.   6.  Anxiety: -Patient reports increased loss of interest and decreased energy over the past 6 to 8 years related to his -Ambulatory referral to Psychology  Disposition: Follow-up with Fransico Him, MD or APP in 3 months Shared Decision Making/Informed Consent The risks [chest pain, shortness of breath, cardiac arrhythmias, dizziness, blood pressure fluctuations, myocardial  infarction, stroke/transient ischemic attack, nausea, vomiting, allergic reaction, radiation exposure, metallic taste sensation and life-threatening complications (estimated to be 1 in 10,000)], benefits (risk stratification, diagnosing coronary artery disease, treatment guidance) and alternatives of a cardiac PET stress test were discussed in detail with Patrick Morse and he agrees to proceed.   Medication Adjustments/Labs and Tests Ordered: Current medicines are reviewed at length with the patient today.  Concerns regarding medicines are outlined above.   Signed, Mable Fill, Marissa Nestle, NP 05/15/2022, 11:13 AM [facility]  Note:  This document was prepared using Dragon voice recognition software and may include unintentional dictation errors.

## 2022-05-15 ENCOUNTER — Ambulatory Visit: Payer: 59 | Attending: Nurse Practitioner | Admitting: Nurse Practitioner

## 2022-05-15 ENCOUNTER — Encounter: Payer: Self-pay | Admitting: Nurse Practitioner

## 2022-05-15 VITALS — BP 120/78 | HR 61 | Ht 68.5 in | Wt 166.0 lb

## 2022-05-15 DIAGNOSIS — R0602 Shortness of breath: Secondary | ICD-10-CM

## 2022-05-15 DIAGNOSIS — I10 Essential (primary) hypertension: Secondary | ICD-10-CM

## 2022-05-15 DIAGNOSIS — R079 Chest pain, unspecified: Secondary | ICD-10-CM | POA: Diagnosis not present

## 2022-05-15 DIAGNOSIS — F419 Anxiety disorder, unspecified: Secondary | ICD-10-CM

## 2022-05-15 DIAGNOSIS — Z720 Tobacco use: Secondary | ICD-10-CM

## 2022-05-15 DIAGNOSIS — I25119 Atherosclerotic heart disease of native coronary artery with unspecified angina pectoris: Secondary | ICD-10-CM | POA: Diagnosis not present

## 2022-05-15 DIAGNOSIS — I471 Supraventricular tachycardia, unspecified: Secondary | ICD-10-CM

## 2022-05-15 NOTE — Patient Instructions (Signed)
Medication Instructions:  YOU CAN TAKE TUMS OR ROLAIDS AS NEEDED FOR HEART BURN *If you need a refill on your cardiac medications before your next appointment, please call your pharmacy*   Lab Work: NONE ORDERED  Testing/Procedures: PET SCAN   Follow-Up: At [facility], you and your health needs are our priority.  As part of our continuing mission to provide you with exceptional heart care, we have created designated Provider Care Teams.  These Care Teams include your primary Cardiologist (physician) and Advanced Practice Providers (APPs -  Physician Assistants and Nurse Practitioners) who all work together to provide you with the care you need, when you need it.  We recommend signing up for the patient portal called "MyChart".  Sign up information is provided on this After Visit Summary.  MyChart is used to connect with patients for Virtual Visits (Telemedicine).  Patients are able to view lab/test results, encounter notes, upcoming appointments, etc.  Non-urgent messages can be sent to your provider as well.   To learn more about what you can do with MyChart, go to [email or web address].    Your next appointment:   3 month(s)  The format for your next appointment:   In Person  Provider:   Robin Searing, NP   Other Instructions You have been referred to Psychologist-Dr Bosie Clos  Important Information About Sugar

## 2022-06-21 ENCOUNTER — Ambulatory Visit: Payer: 59 | Admitting: Psychologist

## 2022-06-29 ENCOUNTER — Ambulatory Visit: Payer: Self-pay | Attending: Nurse Practitioner

## 2022-06-29 DIAGNOSIS — I2510 Atherosclerotic heart disease of native coronary artery without angina pectoris: Secondary | ICD-10-CM

## 2022-06-29 LAB — LIPID PANEL
Chol/HDL Ratio: 2.8 ratio (ref 0.0–5.0)
Cholesterol, Total: 134 mg/dL (ref 100–199)
HDL: 48 mg/dL (ref >39)
LDL Chol Calc (NIH): 71 mg/dL (ref 0–99)
Triglycerides: 72 mg/dL (ref 0–149)
VLDL Cholesterol Cal: 15 mg/dL (ref 5–40)

## 2022-06-29 LAB — HEPATIC FUNCTION PANEL
ALT: 30 IU/L (ref 0–44)
AST: 18 IU/L (ref 0–40)
Albumin: 5.1 g/dL — ABNORMAL HIGH (ref 3.8–4.9)
Alkaline Phosphatase: 59 IU/L (ref 44–121)
Bilirubin Total: 0.9 mg/dL (ref 0.0–1.2)
Bilirubin, Direct: 0.2 mg/dL (ref 0.00–0.40)
Total Protein: 7.7 g/dL (ref 6.0–8.5)

## 2022-08-02 ENCOUNTER — Telehealth (HOSPITAL_COMMUNITY): Payer: Self-pay | Admitting: Nurse Practitioner

## 2022-08-02 NOTE — Telephone Encounter (Signed)
Patient cancelled PET CARDIAC CT for reason below:  Patient (is having colonscopy done, can't have both done at this time. does not wish to reschedule at this time.)   Order will be removed from the active PET CT WQ and when patient calls back to reschedule we will reinstate the order. Thank you.

## 2022-08-08 ENCOUNTER — Other Ambulatory Visit (HOSPITAL_COMMUNITY): Payer: Self-pay

## 2022-12-05 ENCOUNTER — Other Ambulatory Visit: Payer: Self-pay | Admitting: Nurse Practitioner

## 2023-02-09 ENCOUNTER — Ambulatory Visit (HOSPITAL_BASED_OUTPATIENT_CLINIC_OR_DEPARTMENT_OTHER): Payer: Self-pay | Admitting: Family

## 2023-03-13 ENCOUNTER — Other Ambulatory Visit: Payer: Self-pay | Admitting: Nurse Practitioner

## 2023-04-18 NOTE — Progress Notes (Unsigned)
Cardiology Office Note    Patient Name: Patrick Morse Date of Encounter: 04/18/2023  Primary Care Provider:  Joycelyn Rua, MD Primary Cardiologist:  Armanda Magic, MD Primary Electrophysiologist: None   Past Medical History    Past Medical History:  Diagnosis Date   Anxiety    Bilateral kidney stones    BPH (benign prostatic hyperplasia)    CAD (coronary artery disease), native coronary artery    coronary Ca score 80 with 25-49% mLAD by coronary CTA 04/2020 and normal Lexiscan myoview 05/2020   Depression    Dyspepsia    Epigastric pain    Family hx of colon cancer    High cholesterol    Hx of colonic polyps    Hyperlipidemia    Hypertension    SOB (shortness of breath)    SVT (supraventricular tachycardia)    s/p ablation at [facility] Dr. Sampson Goon 2003   Tobacco abuse     History of Present Illness  Patrick Morse is a 57 y.o. male with PMH of SVT s/p ablation 2003, tobacco abuse, HTN, HLD, anxiety who presents today for 1 year follow-up.  Patrick Morse was last seen on 05/15/2022 with ongoing complaint of increased fatigue and chest discomfort.  He noted tightness when lying down. During his visit his blood pressure was stable and we reviewed the results of his cardiac CTA.  During visit we ordered a cardiac PET stress test for further evaluation however this was not completed due to conflict with other diagnostic test.  He was also referred to psychology due to loss of interest and decreased energy.  Patrick Morse presents today for annual follow-up.  During his previous visit he had a complaint of chest pain and increased fatigue, was last seen in December of the previous year. At that time, a stress test was scheduled but was not completed due to a conflict with a planned colonoscopy. Neither the stress test nor the colonoscopy has been completed to date.  He reports no current chest pain and describes their overall health as good, despite occasional feelings of fatigue.  The patient has a history of smoking and admits to occasional marijuana use. Currently, the patient is on Crestor for cholesterol management, but recent tests show cholesterol levels slightly above the desired range. The patient also reports occasional palpitations, which are not described as bothersome or associated with any particular triggers.   Patient denies chest pain, palpitations, dyspnea, PND, orthopnea, nausea, vomiting, dizziness, syncope, edema, weight gain, or early satiety.  Review of Systems  Please see the history of present illness.    All other systems reviewed and are otherwise negative except as noted above.  Physical Exam    Wt Readings from Last 3 Encounters:  05/15/22 166 lb (75.3 kg)  04/24/22 171 lb (77.6 kg)  02/21/22 161 lb (73 kg)   RJ:JOACZ were no vitals filed for this visit.,There is no height or weight on file to calculate BMI. GEN: Well nourished, well developed in no acute distress Neck: No JVD; No carotid bruits Pulmonary: Clear to auscultation without rales, wheezing or rhonchi  Cardiovascular: Normal rate. Regular rhythm. Normal S1. Normal S2.   Murmurs: There is no murmur.  ABDOMEN: Soft, non-tender, non-distended EXTREMITIES:  No edema; No deformity   EKG/LABS/ Recent Cardiac Studies   ECG personally reviewed by me today -sinus rhythm with rate of 65 bpm and no acute changes assessment previous EKG.  Risk Assessment/Calculations:       Lab Results  Component  Value Date   WBC 10.7 (H) 12/15/2011   HGB 15.1 12/15/2011   HCT 42.5 12/15/2011   MCV 88.9 12/15/2011   PLT 279 12/15/2011   Lab Results  Component Value Date   CREATININE 1.00 12/15/2011   BUN 14 12/15/2011   NA 135 12/15/2011   K 4.4 12/15/2011   CL 100 12/15/2011   CO2 26 12/15/2011   Lab Results  Component Value Date   CHOL 134 06/29/2022   HDL 48 06/29/2022   LDLCALC 71 06/29/2022   TRIG 72 06/29/2022   CHOLHDL 2.8 06/29/2022    No results found for:  "HGBA1C" Assessment & Plan    1.Nonobstructive coronary artery disease: -s/p coronary CTA 05/2020 showing no significant coronary disease -Today patient reports no chest pain or angina since previous follow-up. -We discussed the nature of coronary artery disease and the importance of risk factor modification. -Continue current management and lifestyle modifications. -Continue current GDMT with Crestor 20 mg daily, ASA 81 mg daily -Patient also encouraged to increase physical activity as well as 150 minutes/week.  2.  Essential hypertension: -Patient's blood pressure today was well-controlled at 128/74 -Continue lisinopril 10 mg daily  3.Supraventricular tachycardia:  Reports occasional smoking. -Encouraged to quit smoking to reduce cardiovascular risk.  4.  Tobacco abuse: -Reports occasional smoking as well as nightly marijuana use -Encouraged to quit smoking to reduce cardiovascular risk.  5.  Hyperlipidemia: -LDL slightly above goal. Currently on Crestor 10mg  daily with good tolerance. -Increase Crestor to 20mg  daily. -Check cholesterol levels in 3 months.     Disposition: Follow-up with Armanda Magic, MD or APP in 12 months    Signed, Napoleon Form, Leodis Rains, NP 04/18/2023, 9:05 AM [facility]

## 2023-04-19 ENCOUNTER — Other Ambulatory Visit: Payer: Self-pay

## 2023-04-19 ENCOUNTER — Ambulatory Visit: Payer: 59 | Attending: Nurse Practitioner | Admitting: Nurse Practitioner

## 2023-04-19 ENCOUNTER — Encounter: Payer: Self-pay | Admitting: Nurse Practitioner

## 2023-04-19 VITALS — BP 128/74 | HR 73 | Ht 69.0 in | Wt 164.0 lb

## 2023-04-19 DIAGNOSIS — I471 Supraventricular tachycardia, unspecified: Secondary | ICD-10-CM

## 2023-04-19 DIAGNOSIS — I10 Essential (primary) hypertension: Secondary | ICD-10-CM | POA: Diagnosis not present

## 2023-04-19 DIAGNOSIS — E785 Hyperlipidemia, unspecified: Secondary | ICD-10-CM

## 2023-04-19 DIAGNOSIS — I2510 Atherosclerotic heart disease of native coronary artery without angina pectoris: Secondary | ICD-10-CM

## 2023-04-19 DIAGNOSIS — Z720 Tobacco use: Secondary | ICD-10-CM

## 2023-04-19 MED ORDER — ROSUVASTATIN CALCIUM 20 MG PO TABS: 20.0000 mg | ORAL_TABLET | Freq: Every day | ORAL | 1 refills | Status: DC

## 2023-04-19 NOTE — Patient Instructions (Signed)
Medication Instructions:  INCREASE Crestor to 20mg  Take 1 tablet once a day  *If you need a refill on your cardiac medications before your next appointment, please call your pharmacy*   Lab Work: 3 MONTHS FASTING LIPIDS AND LFTS  If you have labs (blood work) drawn today and your tests are completely normal, you will receive your results only by: MyChart Message (if you have MyChart) OR A paper copy in the mail If you have any lab test that is abnormal or we need to change your treatment, we will call you to review the results.   Testing/Procedures: NONE ORDERED   Follow-Up: At [facility], you and your health needs are our priority.  As part of our continuing mission to provide you with exceptional heart care, we have created designated Provider Care Teams.  These Care Teams include your primary Cardiologist (physician) and Advanced Practice Providers (APPs -  Physician Assistants and Nurse Practitioners) who all work together to provide you with the care you need, when you need it.  We recommend signing up for the patient portal called "MyChart".  Sign up information is provided on this After Visit Summary.  MyChart is used to connect with patients for Virtual Visits (Telemedicine).  Patients are able to view lab/test results, encounter notes, upcoming appointments, etc.  Non-urgent messages can be sent to your provider as well.   To learn more about what you can do with MyChart, go to [email or web address].    Your next appointment:   12 month(s)  Provider:   Armanda Magic, MD     Other Instructions

## 2023-06-14 IMAGING — DX DG LUMBAR SPINE 2-3V
3 series · 3 of 3 positions shown · non-contrast
Comparison: September 05, 2012.

CLINICAL DATA: Low back pain for several weeks without known
injury.

EXAM:
LUMBAR SPINE - 2-3 VIEW

[l-spine ap]
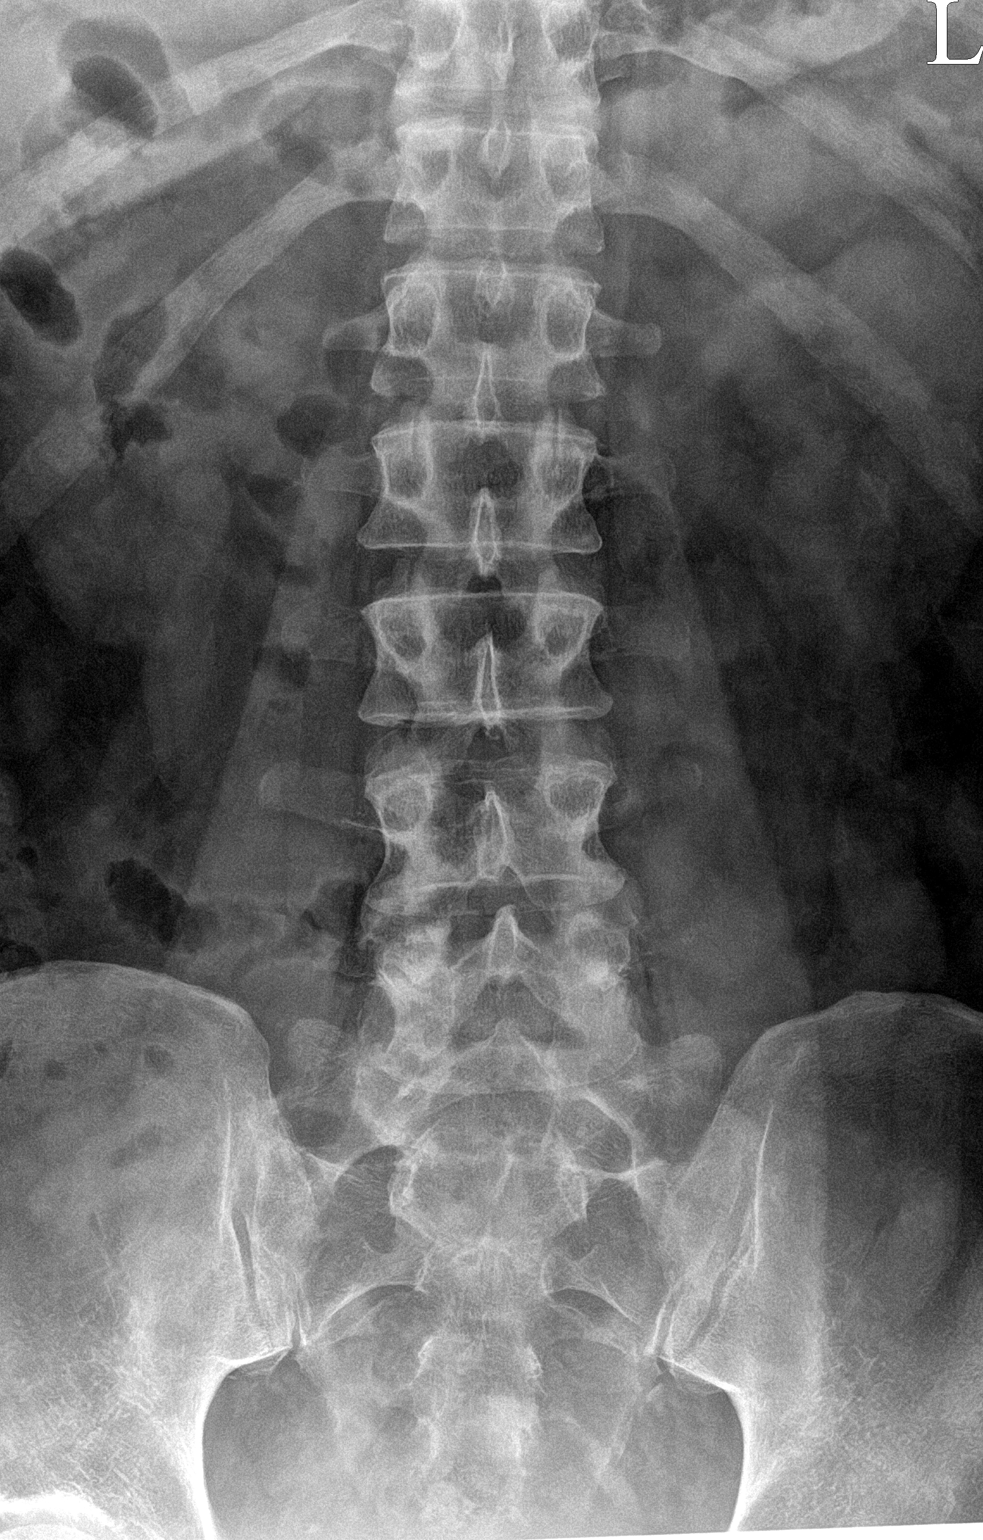

[l-spine lateral]
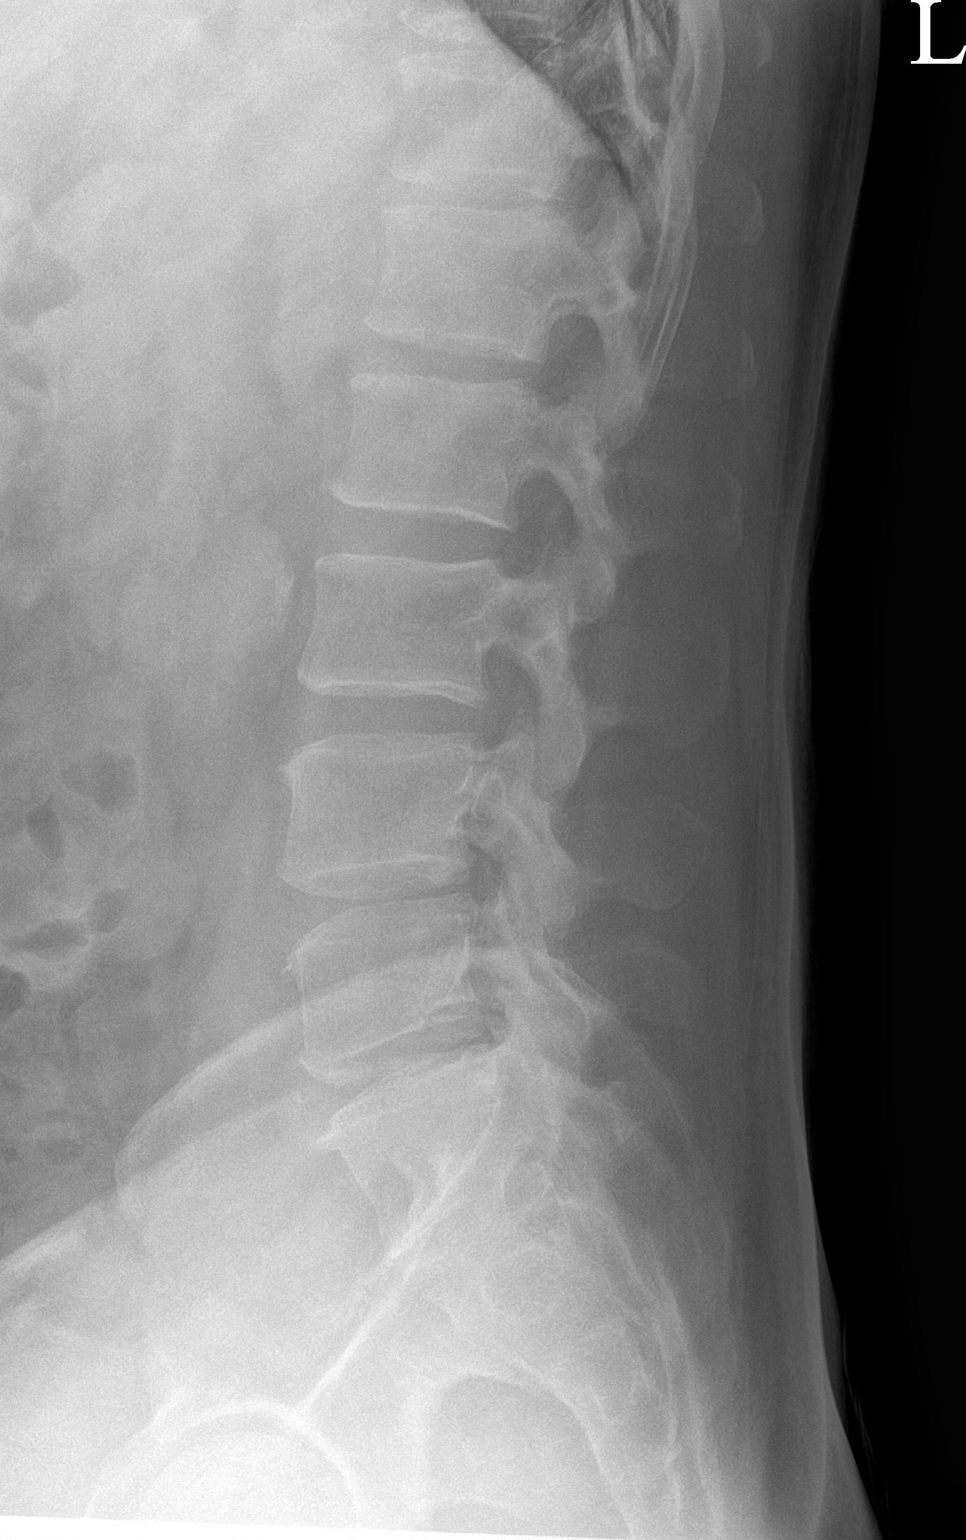

[l-spine spot]
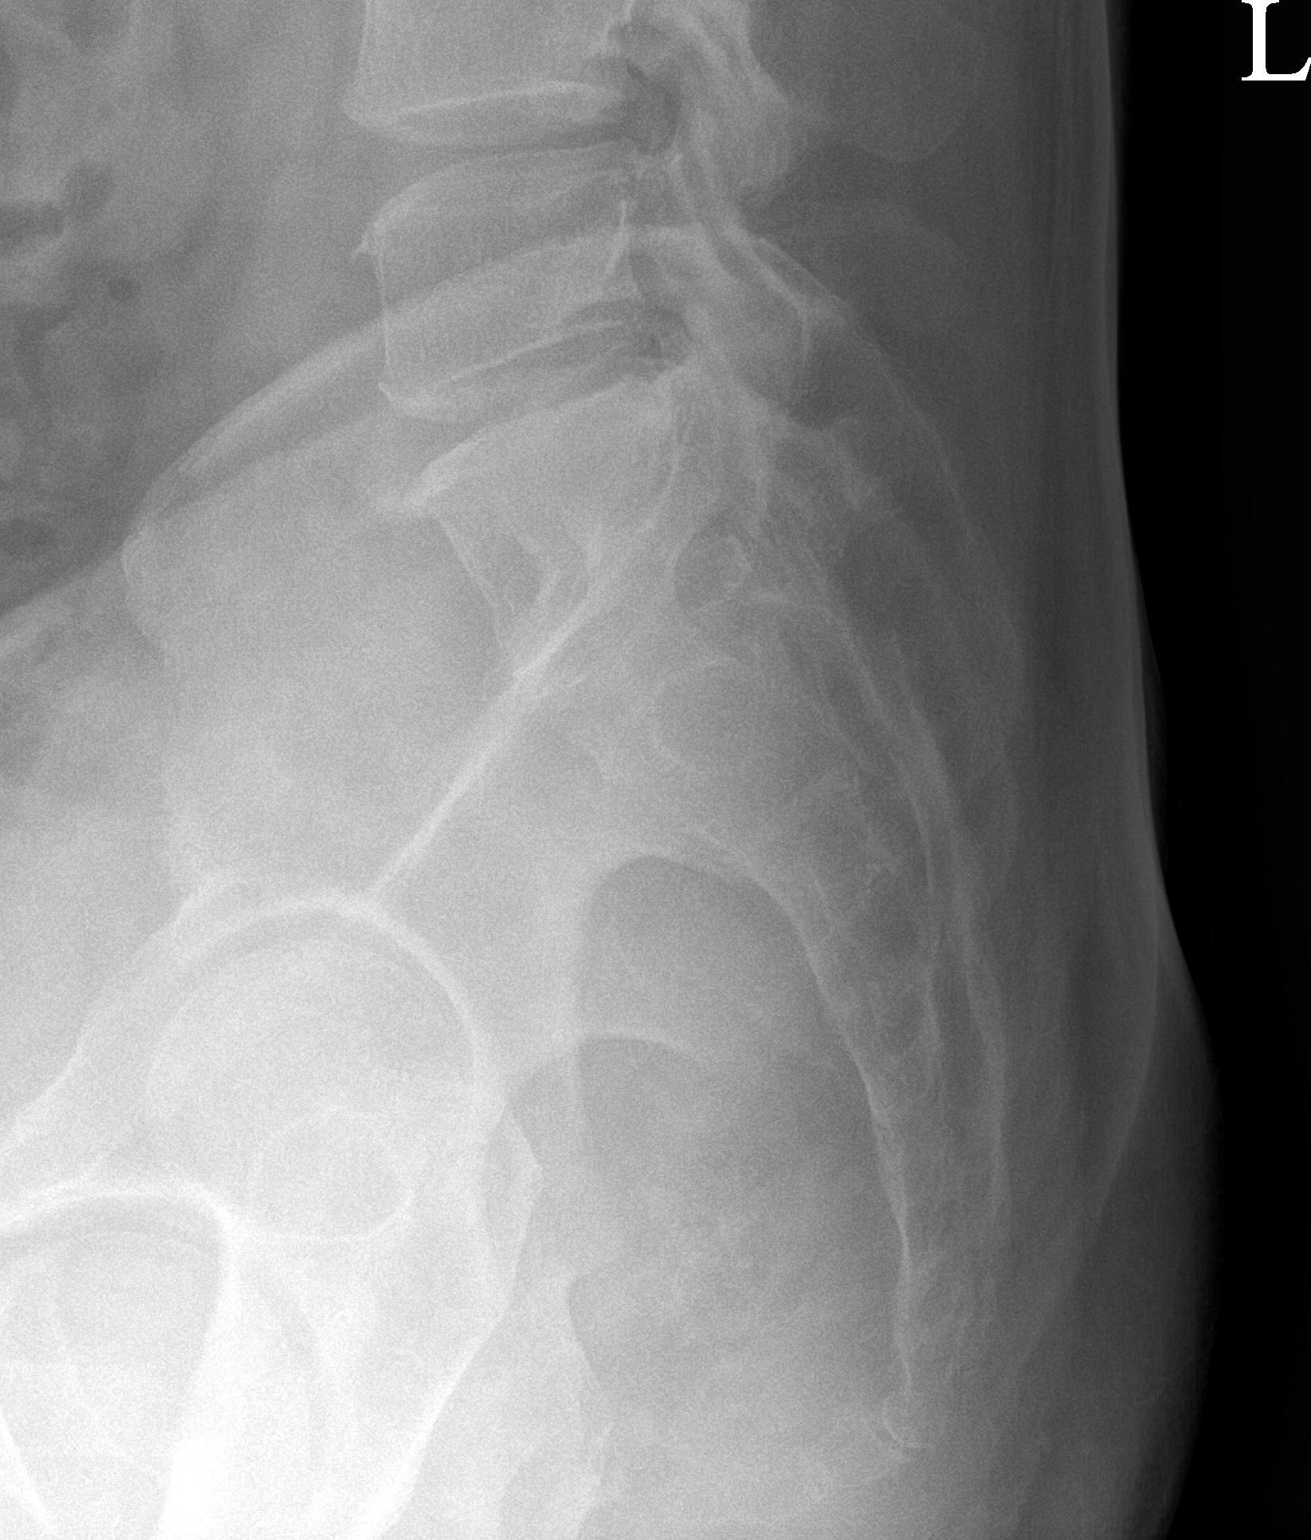

[3 of 3 positions shown; findings below may reference images not displayed]

FINDINGS: There is no evidence of lumbar spine fracture. Alignment is normal.
Mild degenerative disc disease is noted at L4-5 and L5-S1.
IMPRESSION: Mild multilevel degenerative disc disease. No acute abnormality
seen.

## 2023-08-29 ENCOUNTER — Telehealth: Payer: Self-pay | Admitting: Nurse Practitioner

## 2023-08-29 ENCOUNTER — Telehealth: Payer: Self-pay | Admitting: Cardiology

## 2023-08-29 NOTE — Telephone Encounter (Signed)
 Pt c/o Shortness Of Breath: STAT if SOB developed within the last 24 hours or pt is noticeably SOB on the phone  1. Are you currently SOB (can you hear that pt is SOB on the phone)? no  2. How long have you been experiencing SOB? 3 days  3. Are you SOB when sitting or when up moving around? sitting  4. Are you currently experiencing any other symptoms? Heart fluttering,fatigued, lightheaded, heartburn feeling

## 2023-08-29 NOTE — Telephone Encounter (Signed)
 Pt set up for follow up appointment on 4/22

## 2023-08-29 NOTE — Telephone Encounter (Signed)
 Patient c/o Palpitations:  STAT if patient reporting lightheadedness, shortness of breath, or chest pain  How long have you had palpitations/irregular HR/ Afib? Are you having the symptoms now? Palpitations noticeable again for the past few days   Are you currently experiencing lightheadedness, SOB or CP? No symptoms currently   Do you have a history of afib (atrial fibrillation) or irregular heart rhythm? No   Have you checked your BP or HR? (document readings if available): hr 75 today   Are you experiencing any other symptoms?  Dizziness, SOB for past 3-4 days.

## 2023-08-29 NOTE — Telephone Encounter (Signed)
 Pt reports light headedness, fatigue and chest discomfort recently.  It is not CP but discomfort, not occurring daily, lasting only 5-10 seconds.  Denies radiating pain.  Also experiencing some SOB at rest and with activity. HR yesterday was 75.  When asked how he is checking his pulse he reports  manually using neck.  Patient advised to NOT take his pulse via his neck and to take it as his wrist.  States his ex wife passed away August 30, 2023 and has been stressed/depressed since. Advised if chest discomfort worsens, changes in nature and/or associated w/ radiating pain - to go to ED for eval. Aware forwarding to provider for advisement.  Informed he may need an OV to discuss and may be time for an echo for further evaluation.  Patient verbalized understanding and agreeable to plan.

## 2023-08-29 NOTE — Telephone Encounter (Signed)
Duplicate. See other telephone note from today

## 2023-09-04 ENCOUNTER — Ambulatory Visit: Attending: Cardiology | Admitting: Cardiology

## 2023-09-04 ENCOUNTER — Encounter: Payer: Self-pay | Admitting: Cardiology

## 2023-09-04 VITALS — BP 114/74 | HR 77 | Ht 68.0 in | Wt 158.0 lb

## 2023-09-04 DIAGNOSIS — I25119 Atherosclerotic heart disease of native coronary artery with unspecified angina pectoris: Secondary | ICD-10-CM | POA: Diagnosis not present

## 2023-09-04 DIAGNOSIS — I10 Essential (primary) hypertension: Secondary | ICD-10-CM | POA: Diagnosis not present

## 2023-09-04 DIAGNOSIS — E785 Hyperlipidemia, unspecified: Secondary | ICD-10-CM

## 2023-09-04 DIAGNOSIS — I471 Supraventricular tachycardia, unspecified: Secondary | ICD-10-CM

## 2023-09-04 DIAGNOSIS — R079 Chest pain, unspecified: Secondary | ICD-10-CM | POA: Diagnosis not present

## 2023-09-04 NOTE — Addendum Note (Signed)
 Addended by: Armanda Magic R on: 09/04/2023 02:26 PM   Modules accepted: Level of Service

## 2023-09-04 NOTE — H&P (View-Only) (Signed)
 Cardiology  Note    Date:  09/04/2023   ID:  Patrick Morse, DOB [birth year], MRN [id number]  PCP:  Joycelyn Rua, MD  Cardiologist:  Armanda Magic, MD   Chief Complaint  Patient presents with   Coronary Artery Disease   Hypertension   Hyperlipidemia    History of Present Illness:  Patrick Morse is a 58 y.o. male  with a hx of depression and anxiety, HLD, HTN, SVT s/p remote ablation (2003 [facility] Dr. Sampson Goon) and tobacco abuse who was initially referred for evaluation of DOE.   He underwent coronary CTA 06/17/2020 which showed a coronary calcium score was 80 with 25 to 49% mid LAD with normal Lexiscan Myoview.    He was last seen by Robin Searing on 04/19/2023 and was doing well.  He presented to [facility] on 09/01/2023 complaining of chest heaviness that has been intermittent throughout the week prior to his presentation.  He also has complaining of SOB.  He also was complaining of palpitations.  He had a HR in the 150's [place]. On the morning of the ER visit his blood pressure was in the 170s to 180s systolic.  He tells me that his exwife recently passed away and he has been under a lot of stress with his kids going through their loss.  He also has been complaining of fatigue.  High-sensitivity troponin was normal x 2 and BNP was less than 36.  D-dimer was normal.  Chest x-ray was normal.  EKG showed normal sinus rhythm with possible prior anterior infarct no acute ST changes he is now referred back for further cardiac evaluation given recent chest pain.  Since he was discharged from the hospital he has continued to have chest heaviness, fluttering in his chest and shortness of breath.  He says that the SOB is the worst.  He has quit smoking. He denies any PND orthopnea, lower extremity edema or syncope. He has had a few episodes recently where he was driving to work and broke out in a sweat.  Past Medical History:  Diagnosis Date    Anxiety    Bilateral kidney stones    BPH (benign prostatic hyperplasia)    CAD (coronary artery disease), native coronary artery    coronary Ca score 80 with 25-49% mLAD by coronary CTA 04/2020 and normal Lexiscan myoview 05/2020   Depression    Dyspepsia    Epigastric pain    Family hx of colon cancer    High cholesterol    Hx of colonic polyps    Hyperlipidemia    Hypertension    SOB (shortness of breath)    SVT (supraventricular tachycardia) (HCC)    s/p ablation at [facility] Dr. Sampson Goon 2003   Tobacco abuse     Past Surgical History:  Procedure Laterality Date   CARDIAC ELECTROPHYSIOLOGY STUDY AND ABLATION      Current Medications: Current Meds  Medication Sig   aspirin EC 81 MG tablet Take 1 tablet (81 mg total) by mouth daily. Swallow whole.   lisinopril (ZESTRIL) 5 MG tablet Take 5 mg by mouth daily.   rosuvastatin (CRESTOR) 20 MG tablet Take 1 tablet (20 mg total) by mouth daily.    Allergies:   Patient has no known allergies.   Social History   Socioeconomic History   Marital status: Married    Spouse name: Not on file   Number of children: Not on file  Years of education: Not on file   Highest education level: Not on file  Occupational History   Not on file  Tobacco Use   Smoking status: Every Day    Current packs/day: 0.50    Types: Cigarettes   Smokeless tobacco: Current  Substance and Sexual Activity   Alcohol use: Yes   Drug use: Yes    Types: Marijuana   Sexual activity: Not on file  Other Topics Concern   Not on file  Social History Narrative   Not on file   Social Drivers of Health   Financial Resource Strain: Not on file  Food Insecurity: Not on file  Transportation Needs: Not on file  Physical Activity: Not on file  Stress: Not on file  Social Connections: Not on file     Family History:  The patient's family history is not on file.   ROS:   Please see the history of present illness.    ROS All other systems reviewed and  are negative.      No data to display             PHYSICAL EXAM:   VS:  BP 114/74   Pulse 77   Ht 5\' 8"  (1.727 m)   Wt 158 lb (71.7 kg)   SpO2 97%   BMI 24.02 kg/m    GEN: Well nourished, well developed in no acute distress HEENT: Normal NECK: No JVD; No carotid bruits LYMPHATICS: No lymphadenopathy CARDIAC:RRR, no murmurs, rubs, gallops RESPIRATORY:  Clear to auscultation without rales, wheezing or rhonchi  ABDOMEN: Soft, non-tender, non-distended MUSCULOSKELETAL:  No edema; No deformity  SKIN: Warm and dry NEUROLOGIC:  Alert and oriented x 3 PSYCHIATRIC:  Normal affect  Wt Readings from Last 3 Encounters:  09/04/23 158 lb (71.7 kg)  04/19/23 164 lb (74.4 kg)  05/15/22 166 lb (75.3 kg)      Studies/Labs Reviewed:    Recent Labs: No results found for requested labs within last 365 days.   Lipid Panel    Component Value Date/Time   CHOL 134 06/29/2022 0941   TRIG 72 06/29/2022 0941   HDL 48 06/29/2022 0941   CHOLHDL 2.8 06/29/2022 0941   LDLCALC 71 06/29/2022 0941       Additional studies/ records that were reviewed today include:  EKG, labs and records from [facility] [place] visit 09/01/2023    ASSESSMENT:    1. Chest pain of uncertain etiology   2. Coronary artery disease involving native coronary artery of native heart with angina pectoris (HCC)   3. Benign essential HTN   4. SVT (supraventricular tachycardia) (HCC)   5. Dyslipidemia, goal LDL below 70       PLAN:  In order of problems listed above:  #Chest pain #ASCAD -Coronary CTA 06/17/2020 which showed a coronary calcium score was 80 with 25 to 49% mid LAD  -normal Lexiscan Myoview 05/2020 -Now having  recurrent SOB only at rest and chest heaviness intermittently throughout the past week with normal workup in the ER. His sx are somewhat atypical and he can go walk 2-3 miles with no problems but then get sx at rest. His BP has been significantly elevated as well  recently an may be contributing with demand ischemia -he has continued to smoke and just stopped 2 weeks ago -He clearly may have progression of CAD and SOB may be an anginal equivalent.  I think we should proceed with cath tomorrow to define coronary anatomy -Informed Consent  Shared Decision Making/Informed Consent The risks [stroke (1 in 1000), death (1 in 1000), kidney failure [usually temporary] (1 in 500), bleeding (1 in 200), allergic reaction [possibly serious] (1 in 200)], benefits (diagnostic support and management of coronary artery disease) and alternatives of a cardiac catheterization were discussed in detail with Patrick Morse and he is willing to proceed.  -2D echo -Continue prescription drug managed with aspirin 81 mg daily and Crestor 20 mg daily with as needed refills    #HTN -BP controlled on exam today but has been very high at home at times>>he has been under a lot of stress at home which may be contributing -I have asked him to check his BP twice daily for a week and call with results -Continue prescription management with lisinopril 5 mg daily with as needed refills  #SVT -s/p remote ablation 2003 -He denies any further episodes of SVT  #Hyperlipidemia -LDL goal less than 70 -Continue prescription drug management with Crestor 20 mg daily with as needed refills    Medication Adjustments/Labs and Tests Ordered: Current medicines are reviewed at length with the patient today.  Concerns regarding medicines are outlined above.  Medication changes, Labs and Tests ordered today are listed in the Patient Instructions below.  There are no Patient Instructions on file for this visit.   Signed, Armanda Magic, MD  09/04/2023 1:55 PM    [facility] [address] [place], [place], [place]  [address] Phone: [phone]; Fax: [phone]

## 2023-09-04 NOTE — Progress Notes (Addendum)
 Cardiology  Note    Date:  09/04/2023   ID:  Patrick Morse, DOB [birth year], MRN [id number]  PCP:  Joycelyn Rua, MD  Cardiologist:  Armanda Magic, MD   Chief Complaint  Patient presents with   Coronary Artery Disease   Hypertension   Hyperlipidemia    History of Present Illness:  Patrick Morse is a 58 y.o. male  with a hx of depression and anxiety, HLD, HTN, SVT s/p remote ablation (2003 [facility] Dr. Sampson Goon) and tobacco abuse who was initially referred for evaluation of DOE.   He underwent coronary CTA 06/17/2020 which showed a coronary calcium score was 80 with 25 to 49% mid LAD with normal Lexiscan Myoview.    He was last seen by Robin Searing on 04/19/2023 and was doing well.  He presented to [facility] on 09/01/2023 complaining of chest heaviness that has been intermittent throughout the week prior to his presentation.  He also has complaining of SOB.  He also was complaining of palpitations.  He had a HR in the 150's [place]. On the morning of the ER visit his blood pressure was in the 170s to 180s systolic.  He tells me that his exwife recently passed away and he has been under a lot of stress with his kids going through their loss.  He also has been complaining of fatigue.  High-sensitivity troponin was normal x 2 and BNP was less than 36.  D-dimer was normal.  Chest x-ray was normal.  EKG showed normal sinus rhythm with possible prior anterior infarct no acute ST changes he is now referred back for further cardiac evaluation given recent chest pain.  Since he was discharged from the hospital he has continued to have chest heaviness, fluttering in his chest and shortness of breath.  He says that the SOB is the worst.  He has quit smoking. He denies any PND orthopnea, lower extremity edema or syncope. He has had a few episodes recently where he was driving to work and broke out in a sweat.  Past Medical History:  Diagnosis Date    Anxiety    Bilateral kidney stones    BPH (benign prostatic hyperplasia)    CAD (coronary artery disease), native coronary artery    coronary Ca score 80 with 25-49% mLAD by coronary CTA 04/2020 and normal Lexiscan myoview 05/2020   Depression    Dyspepsia    Epigastric pain    Family hx of colon cancer    High cholesterol    Hx of colonic polyps    Hyperlipidemia    Hypertension    SOB (shortness of breath)    SVT (supraventricular tachycardia) (HCC)    s/p ablation at [facility] Dr. Sampson Goon 2003   Tobacco abuse     Past Surgical History:  Procedure Laterality Date   CARDIAC ELECTROPHYSIOLOGY STUDY AND ABLATION      Current Medications: Current Meds  Medication Sig   aspirin EC 81 MG tablet Take 1 tablet (81 mg total) by mouth daily. Swallow whole.   lisinopril (ZESTRIL) 5 MG tablet Take 5 mg by mouth daily.   rosuvastatin (CRESTOR) 20 MG tablet Take 1 tablet (20 mg total) by mouth daily.    Allergies:   Patient has no known allergies.   Social History   Socioeconomic History   Marital status: Married    Spouse name: Not on file   Number of children: Not on file  Years of education: Not on file   Highest education level: Not on file  Occupational History   Not on file  Tobacco Use   Smoking status: Every Day    Current packs/day: 0.50    Types: Cigarettes   Smokeless tobacco: Current  Substance and Sexual Activity   Alcohol use: Yes   Drug use: Yes    Types: Marijuana   Sexual activity: Not on file  Other Topics Concern   Not on file  Social History Narrative   Not on file   Social Drivers of Health   Financial Resource Strain: Not on file  Food Insecurity: Not on file  Transportation Needs: Not on file  Physical Activity: Not on file  Stress: Not on file  Social Connections: Not on file     Family History:  The patient's family history is not on file.   ROS:   Please see the history of present illness.    ROS All other systems reviewed and  are negative.      No data to display             PHYSICAL EXAM:   VS:  BP 114/74   Pulse 77   Ht 5\' 8"  (1.727 m)   Wt 158 lb (71.7 kg)   SpO2 97%   BMI 24.02 kg/m    GEN: Well nourished, well developed in no acute distress HEENT: Normal NECK: No JVD; No carotid bruits LYMPHATICS: No lymphadenopathy CARDIAC:RRR, no murmurs, rubs, gallops RESPIRATORY:  Clear to auscultation without rales, wheezing or rhonchi  ABDOMEN: Soft, non-tender, non-distended MUSCULOSKELETAL:  No edema; No deformity  SKIN: Warm and dry NEUROLOGIC:  Alert and oriented x 3 PSYCHIATRIC:  Normal affect  Wt Readings from Last 3 Encounters:  09/04/23 158 lb (71.7 kg)  04/19/23 164 lb (74.4 kg)  05/15/22 166 lb (75.3 kg)      Studies/Labs Reviewed:    Recent Labs: No results found for requested labs within last 365 days.   Lipid Panel    Component Value Date/Time   CHOL 134 06/29/2022 0941   TRIG 72 06/29/2022 0941   HDL 48 06/29/2022 0941   CHOLHDL 2.8 06/29/2022 0941   LDLCALC 71 06/29/2022 0941       Additional studies/ records that were reviewed today include:  EKG, labs and records from [facility] visit 09/01/2023    ASSESSMENT:    1. Chest pain of uncertain etiology   2. Coronary artery disease involving native coronary artery of native heart with angina pectoris (HCC)   3. Benign essential HTN   4. SVT (supraventricular tachycardia) (HCC)   5. Dyslipidemia, goal LDL below 70       PLAN:  In order of problems listed above:  #Chest pain #ASCAD -Coronary CTA 06/17/2020 which showed a coronary calcium score was 80 with 25 to 49% mid LAD  -normal Lexiscan Myoview 05/2020 -Now having  recurrent SOB only at rest and chest heaviness intermittently throughout the past week with normal workup in the ER. His sx are somewhat atypical and he can go walk 2-3 miles with no problems but then get sx at rest. His BP has been significantly elevated as well  recently an may be contributing with demand ischemia -he has continued to smoke and just stopped 2 weeks ago -He clearly may have progression of CAD and SOB may be an anginal equivalent.  I think we should proceed with cath tomorrow to define coronary anatomy -Informed Consent  Shared Decision Making/Informed Consent The risks [stroke (1 in 1000), death (1 in 1000), kidney failure [usually temporary] (1 in 500), bleeding (1 in 200), allergic reaction [possibly serious] (1 in 200)], benefits (diagnostic support and management of coronary artery disease) and alternatives of a cardiac catheterization were discussed in detail with Patrick Morse and he is willing to proceed.  -2D echo -Continue prescription drug managed with aspirin 81 mg daily and Crestor 20 mg daily with as needed refills    #HTN -BP controlled on exam today but has been very high at home at times>>he has been under a lot of stress at home which may be contributing -I have asked him to check his BP twice daily for a week and call with results -Continue prescription management with lisinopril 5 mg daily with as needed refills  #SVT -s/p remote ablation 2003 -He denies any further episodes of SVT  #Hyperlipidemia -LDL goal less than 70 -Continue prescription drug management with Crestor 20 mg daily with as needed refills    Medication Adjustments/Labs and Tests Ordered: Current medicines are reviewed at length with the patient today.  Concerns regarding medicines are outlined above.  Medication changes, Labs and Tests ordered today are listed in the Patient Instructions below.  There are no Patient Instructions on file for this visit.   Signed, Armanda Magic, MD  09/04/2023 1:55 PM    [facility] [address] [place], [place], [place]  [address] Phone: [phone]; Fax: [phone]

## 2023-09-04 NOTE — Patient Instructions (Signed)
 Medication Instructions:  Your physician recommends that you continue on your current medications as directed. Please refer to the Current Medication list given to you today.  *If you need a refill on your cardiac medications before your next appointment, please call your pharmacy*  Lab Work: NONE If you have labs (blood work) drawn today and your tests are completely normal, you will receive your results only by: MyChart Message (if you have MyChart) OR A paper copy in the mail If you have any lab test that is abnormal or we need to change your treatment, we will call you to review the results.  Testing/Procedures: Echo after Cath Your physician has requested that you have an echocardiogram. Echocardiography is a painless test that uses sound waves to create images of your heart. It provides your doctor with information about the size and shape of your heart and how well your heart's chambers and valves are working. This procedure takes approximately one hour. There are no restrictions for this procedure. Please do NOT wear cologne, perfume, aftershave, or lotions (deodorant is allowed). Please arrive 15 minutes prior to your appointment time.  Please note: We ask at that you not bring children with you during ultrasound (echo/ vascular) testing. Due to room size and safety concerns, children are not allowed in the ultrasound rooms during exams. Our front office staff cannot provide observation of children in our lobby area while testing is being conducted. An adult accompanying a patient to their appointment will only be allowed in the ultrasound room at the discretion of the ultrasound technician under special circumstances. We apologize for any inconvenience.   Follow-Up: At [facility], you and your health needs are our priority.  As part of our continuing mission to provide you with exceptional heart care, our providers are all part of one team.  This team includes your primary  Cardiologist (physician) and Advanced Practice Providers or APPs (Physician Assistants and Nurse Practitioners) who all work together to provide you with the care you need, when you need it.  Your next appointment:   1 year(s)  Provider:   Armanda Magic, MD     We recommend signing up for the patient portal called "MyChart".  Sign up information is provided on this After Visit Summary.  MyChart is used to connect with patients for Virtual Visits (Telemedicine).  Patients are able to view lab/test results, encounter notes, upcoming appointments, etc.  Non-urgent messages can be sent to your provider as well.   To learn more about what you can do with MyChart, go to [email or web address].   Other Instructions  You are scheduled for a Cardiac Catheterization on Wednesday, April 9 with Dr.  Jacinto Halim .  1. Please arrive at the [facility] (Main Entrance A) at [facility]: [address] [place], [place] [address] at 12:30 PM (This time is 2 hour(s) before your procedure to ensure your preparation).   Free valet parking service is available. You will check in at ADMITTING. The support person will be asked to wait in the waiting room.  It is OK to have someone drop you off and come back when you are ready to be discharged.    Special note: Every effort is made to have your procedure done on time. Please understand that emergencies sometimes delay scheduled procedures.  2. Diet: Do not eat solid foods after midnight.  The patient may have clear liquids until 5am upon the day of the procedure.  3. Labs: NONE  4.  Medication instructions in preparation for your procedure:   Contrast Allergy: No    On the morning of your procedure, take your Aspirin 81 mg and any morning medicines NOT listed above.  You may use sips of water.  5. Plan to go home the same day, you will only stay overnight if medically necessary. 6. Bring a current list of your medications and current insurance cards. 7.  You MUST have a responsible person to drive you home. 8. Someone MUST be with you the first 24 hours after you arrive home or your discharge will be delayed. 9. Please wear clothes that are easy to get on and off and wear slip-on shoes.  Thank you for allowing [place] to care for you!   -- [facility]        1st Floor: - Lobby - Registration  - Pharmacy  - Lab - Cafe  2nd Floor: - PV Lab - Diagnostic Testing (echo, CT, nuclear med)  3rd Floor: - Vacant  4th Floor: - TCTS (cardiothoracic surgery) - AFib Clinic - Structural Heart Clinic - Vascular Surgery  - Vascular Ultrasound  5th Floor: - [facility]Care Cardiology (general and EP) - Clinical Pharmacy for coumadin, hypertension, lipid, weight-loss medications, and med management appointments    Valet parking services will be available as well.

## 2023-09-04 NOTE — Addendum Note (Signed)
 Addended by: Erick Alley on: 09/04/2023 02:34 PM   Modules accepted: Orders

## 2023-09-05 ENCOUNTER — Encounter (HOSPITAL_COMMUNITY): Payer: Self-pay | Admitting: Cardiology

## 2023-09-05 ENCOUNTER — Ambulatory Visit (HOSPITAL_COMMUNITY)
Admission: RE | Admit: 2023-09-05 | Discharge: 2023-09-05 | Disposition: A | Discharge status: Home / Self Care | Attending: Cardiology | Admitting: Cardiology

## 2023-09-05 ENCOUNTER — Encounter (HOSPITAL_COMMUNITY)
Admission: RE | Disposition: A | Discharge status: Home / Self Care | Payer: Self-pay | Source: Home / Self Care | Attending: Cardiology

## 2023-09-05 ENCOUNTER — Other Ambulatory Visit: Payer: Self-pay

## 2023-09-05 DIAGNOSIS — E7800 Pure hypercholesterolemia, unspecified: Secondary | ICD-10-CM | POA: Insufficient documentation

## 2023-09-05 DIAGNOSIS — Z87891 Personal history of nicotine dependence: Secondary | ICD-10-CM | POA: Diagnosis not present

## 2023-09-05 DIAGNOSIS — R079 Chest pain, unspecified: Secondary | ICD-10-CM | POA: Diagnosis present

## 2023-09-05 DIAGNOSIS — I10 Essential (primary) hypertension: Secondary | ICD-10-CM | POA: Insufficient documentation

## 2023-09-05 DIAGNOSIS — Z8679 Personal history of other diseases of the circulatory system: Secondary | ICD-10-CM | POA: Insufficient documentation

## 2023-09-05 DIAGNOSIS — Z79899 Other long term (current) drug therapy: Secondary | ICD-10-CM | POA: Diagnosis not present

## 2023-09-05 HISTORY — PX: LEFT HEART CATH AND CORONARY ANGIOGRAPHY: CATH118249

## 2023-09-05 SURGERY — LEFT HEART CATH AND CORONARY ANGIOGRAPHY
Anesthesia: LOCAL

## 2023-09-05 MED ORDER — VERAPAMIL HCL 2.5 MG/ML IV SOLN
INTRAVENOUS | Status: AC
  Filled 2023-09-05: qty 2

## 2023-09-05 MED ORDER — HEPARIN SODIUM (PORCINE) 1000 UNIT/ML IJ SOLN
INTRAMUSCULAR | Status: DC | PRN
  Administered 2023-09-05: 4000 [IU] via INTRAVENOUS

## 2023-09-05 MED ORDER — FENTANYL CITRATE (PF) 100 MCG/2ML IJ SOLN
INTRAMUSCULAR | Status: DC | PRN
  Administered 2023-09-05: 25 ug via INTRAVENOUS

## 2023-09-05 MED ORDER — HEPARIN SODIUM (PORCINE) 1000 UNIT/ML IJ SOLN
INTRAMUSCULAR | Status: AC
  Filled 2023-09-05: qty 10

## 2023-09-05 MED ORDER — ASPIRIN 81 MG PO CHEW: 81.0000 mg | CHEWABLE_TABLET | ORAL | Status: DC

## 2023-09-05 MED ORDER — MIDAZOLAM HCL 2 MG/2ML IJ SOLN
INTRAMUSCULAR | Status: AC
  Filled 2023-09-05: qty 2

## 2023-09-05 MED ORDER — LIDOCAINE HCL (PF) 1 % IJ SOLN
INTRAMUSCULAR | Status: AC
  Filled 2023-09-05: qty 30

## 2023-09-05 MED ORDER — LIDOCAINE HCL (PF) 1 % IJ SOLN
INTRAMUSCULAR | Status: DC | PRN
  Administered 2023-09-05: 2 mL

## 2023-09-05 MED ORDER — HEPARIN (PORCINE) IN NACL 1000-0.9 UT/500ML-% IV SOLN
INTRAVENOUS | Status: DC | PRN
Start: 2023-09-05 — End: 2023-09-05
  Administered 2023-09-05 (×2): 500 mL

## 2023-09-05 MED ORDER — HEPARIN (PORCINE) IN NACL 2-0.9 UNITS/ML
INTRAMUSCULAR | Status: DC | PRN
  Administered 2023-09-05: 10 mL via INTRA_ARTERIAL

## 2023-09-05 MED ORDER — SODIUM CHLORIDE 0.9 % WEIGHT BASED INFUSION: 1.0000 mL/kg/h | INTRAVENOUS | Status: DC

## 2023-09-05 MED ORDER — MIDAZOLAM HCL 2 MG/2ML IJ SOLN
INTRAMUSCULAR | Status: DC | PRN
  Administered 2023-09-05: 2 mg via INTRAVENOUS

## 2023-09-05 MED ORDER — SODIUM CHLORIDE 0.9 % WEIGHT BASED INFUSION
3.0000 mL/kg/h | INTRAVENOUS | Status: AC
Start: 2023-09-05 — End: 2023-09-05

## 2023-09-05 MED ORDER — FENTANYL CITRATE (PF) 100 MCG/2ML IJ SOLN
INTRAMUSCULAR | Status: AC
  Filled 2023-09-05: qty 2

## 2023-09-05 MED ORDER — IOHEXOL 350 MG/ML SOLN
INTRAVENOUS | Status: DC | PRN
  Administered 2023-09-05: 20 mL

## 2023-09-05 SURGICAL SUPPLY — 7 items
CATH INFINITI AMBI 5FR TG (CATHETERS) IMPLANT
DEVICE RAD COMP TR BAND LRG (VASCULAR PRODUCTS) IMPLANT
GLIDESHEATH SLEND A-KIT 6F 22G (SHEATH) IMPLANT
GUIDEWIRE INQWIRE 1.5J.035X260 (WIRE) IMPLANT
INQWIRE 1.5J .035X260CM (WIRE) ×1 IMPLANT
PACK CARDIAC CATHETERIZATION (CUSTOM PROCEDURE TRAY) ×1 IMPLANT
SET ATX-X65L (MISCELLANEOUS) IMPLANT

## 2023-09-05 NOTE — Discharge Instructions (Signed)

## 2023-09-05 NOTE — Progress Notes (Signed)
 TR band removed at 1725, gauze dressing applied. Right radial level 0, clean dry, and intact.

## 2023-09-05 NOTE — Interval H&P Note (Signed)
 History and Physical Interval Note:  09/05/2023 3:25 PM  Patrick Morse  has presented today for surgery, with the diagnosis of chest pain.  The various methods of treatment have been discussed with the patient and family. After consideration of risks, benefits and other options for treatment, the patient has consented to  Procedure(s): LEFT HEART CATH AND CORONARY ANGIOGRAPHY (N/A) and possible coronary angioplasty as a surgical intervention.  The patient's history has been reviewed, patient examined, no change in status, stable for surgery.  I have reviewed the patient's chart and labs.  Questions were answered to the patient's satisfaction.   Cath Lab Visit (complete for each Cath Lab visit)  Clinical Evaluation Leading to the Procedure:   ACS: No.  Non-ACS:    Anginal Classification: CCS III  Anti-ischemic medical therapy: No Therapy  Non-Invasive Test Results: No non-invasive testing performed  Prior CABG: No previous CABG    Yates Decamp

## 2023-09-10 ENCOUNTER — Ambulatory Visit (HOSPITAL_COMMUNITY): Attending: Cardiology

## 2023-09-10 DIAGNOSIS — R079 Chest pain, unspecified: Secondary | ICD-10-CM | POA: Diagnosis present

## 2023-09-10 LAB — ECHOCARDIOGRAM COMPLETE
Area-P 1/2: 2.23 cm2
S' Lateral: 2.9 cm

## 2023-09-18 ENCOUNTER — Ambulatory Visit: Admitting: Nurse Practitioner

## 2023-11-19 ENCOUNTER — Other Ambulatory Visit: Payer: Self-pay | Admitting: Nurse Practitioner

## 2024-01-04 ENCOUNTER — Telehealth: Payer: Self-pay | Admitting: Cardiology

## 2024-01-04 ENCOUNTER — Other Ambulatory Visit (HOSPITAL_COMMUNITY): Payer: Self-pay

## 2024-01-04 MED ORDER — LISINOPRIL 5 MG PO TABS
5.0000 mg | ORAL_TABLET | Freq: Every day | ORAL | 0 refills | Status: DC
  Filled 2024-01-04: qty 30, 30d supply, fill #0

## 2024-01-04 NOTE — Telephone Encounter (Signed)
*  STAT* If patient is at the pharmacy, call can be transferred to refill team.   1. Which medications need to be refilled? (please list name of each medication and dose if known) lisinopril  (ZESTRIL ) 5 MG tablet    2. Would you like to learn more about the convenience, safety, & potential cost savings by using the [facility]?      3. Are you open to using the [facility] (Type [facility]. ).   4. Which pharmacy/location (including street and city if local pharmacy) is medication to be sent to? [id number] - [place], [place] - [address]    5. Do they need a 30 day or 90 day supply? 90 day

## 2024-01-04 NOTE — Telephone Encounter (Signed)
 Pt of Dr. Shlomo. Please advise on this refill. Pt was last seen here on 4/8 and in hospital on 4/9.

## 2024-01-14 ENCOUNTER — Telehealth: Payer: Self-pay | Admitting: Cardiology

## 2024-01-14 ENCOUNTER — Other Ambulatory Visit: Payer: Self-pay

## 2024-01-14 ENCOUNTER — Other Ambulatory Visit (HOSPITAL_COMMUNITY): Payer: Self-pay

## 2024-01-14 MED ORDER — LISINOPRIL 5 MG PO TABS: 5.0000 mg | ORAL_TABLET | Freq: Every day | ORAL | 2 refills | Status: DC

## 2024-01-14 MED ORDER — ROSUVASTATIN CALCIUM 20 MG PO TABS: 20.0000 mg | ORAL_TABLET | Freq: Every day | ORAL | 2 refills | Status: DC

## 2024-01-14 MED ORDER — ROSUVASTATIN CALCIUM 20 MG PO TABS
20.0000 mg | ORAL_TABLET | Freq: Every day | ORAL | 2 refills | Status: DC
  Filled 2024-01-14: qty 30, 30d supply, fill #0

## 2024-01-14 MED ORDER — LISINOPRIL 5 MG PO TABS
5.0000 mg | ORAL_TABLET | Freq: Every day | ORAL | 2 refills | Status: DC
Start: 2024-01-14 — End: 2024-01-14

## 2024-01-14 NOTE — Telephone Encounter (Signed)
*  STAT* If patient is at the pharmacy, call can be transferred to refill team.   1. Which medications need to be refilled? (please list name of each medication and dose if known)   rosuvastatin  (CRESTOR ) 20 MG tablet   2. Which pharmacy/location (including street and city if local pharmacy) is medication to be sent to?  [facility] - [place], [place] - [address] Phone: [phone]  Fax: [phone]     3. Do they need a 30 day or 90 day supply? 90  Pt is out of medication

## 2024-01-14 NOTE — Telephone Encounter (Signed)
*  STAT* If patient is at the pharmacy, call can be transferred to refill team.   1. Which medications need to be refilled? (please list name of each medication and dose if known)  lisinopril  (ZESTRIL ) 5 MG tablet  2. Which pharmacy/location (including street and city if local pharmacy) is medication to be sent to? [id number] - [place], [place] - [address] Phone: [phone]  Fax: [phone]   3. Do they need a 30 day or 90 day supply? 90 Pt is out of medication

## 2024-01-15 ENCOUNTER — Other Ambulatory Visit (HOSPITAL_COMMUNITY): Payer: Self-pay

## 2024-01-16 ENCOUNTER — Telehealth: Payer: Self-pay | Admitting: Cardiology

## 2024-01-16 MED ORDER — LISINOPRIL 5 MG PO TABS: 5.0000 mg | ORAL_TABLET | Freq: Every day | ORAL | 2 refills | Status: DC

## 2024-01-16 NOTE — Telephone Encounter (Signed)
*  STAT* If patient is at the pharmacy, call can be transferred to refill team.   1. Which medications need to be refilled? (please list name of each medication and dose if known)   lisinopril  (ZESTRIL ) 5 MG tablet   2. Would you like to learn more about the convenience, safety, & cost savings by using the [facility]?   3. Are you open to using the [facility] (Type [facility]. ).  4. Which pharmacy/location (including street and city if local pharmacy) is medication to be sent to?  [id number] - [place], [place] - [address]   5. Do they need a 30 day or 90 day supply?   90 day  Patient stated he is completely out of this medication and will be going out of town.  Patient has an appointment schedule with Dr. Shlomo on 11/17.  Patient noted the prescription was sent to [facility] and he needs it sent to the [id number] - [place], [place] - [address].

## 2024-04-14 ENCOUNTER — Encounter: Payer: Self-pay | Admitting: Cardiology

## 2024-04-14 ENCOUNTER — Ambulatory Visit: Attending: Cardiology | Admitting: Cardiology

## 2024-04-14 VITALS — BP 124/72 | HR 62 | Ht 68.0 in | Wt 161.4 lb

## 2024-04-14 DIAGNOSIS — I471 Supraventricular tachycardia, unspecified: Secondary | ICD-10-CM | POA: Diagnosis not present

## 2024-04-14 DIAGNOSIS — I10 Essential (primary) hypertension: Secondary | ICD-10-CM | POA: Diagnosis not present

## 2024-04-14 DIAGNOSIS — R079 Chest pain, unspecified: Secondary | ICD-10-CM | POA: Diagnosis not present

## 2024-04-14 DIAGNOSIS — E785 Hyperlipidemia, unspecified: Secondary | ICD-10-CM

## 2024-04-14 LAB — COMPREHENSIVE METABOLIC PANEL WITH GFR
ALT: 14 IU/L (ref 0–44)
AST: 19 IU/L (ref 0–40)
Albumin: 4.5 g/dL (ref 3.8–4.9)
Alkaline Phosphatase: 53 IU/L (ref 47–123)
BUN/Creatinine Ratio: 18 (ref 9–20)
BUN: 18 mg/dL (ref 6–24)
Bilirubin Total: 0.5 mg/dL (ref 0.0–1.2)
CO2: 24 mmol/L (ref 20–29)
Calcium: 9.7 mg/dL (ref 8.7–10.2)
Chloride: 102 mmol/L (ref 96–106)
Creatinine, Ser: 1.01 mg/dL (ref 0.76–1.27)
Globulin, Total: 2.6 g/dL (ref 1.5–4.5)
Glucose: 108 mg/dL — ABNORMAL HIGH (ref 70–99)
Potassium: 5.2 mmol/L (ref 3.5–5.2)
Sodium: 138 mmol/L (ref 134–144)
Total Protein: 7.1 g/dL (ref 6.0–8.5)
eGFR: 86 mL/min/1.73 (ref >59)

## 2024-04-14 LAB — LIPID PANEL
Chol/HDL Ratio: 3.7 ratio (ref 0.0–5.0)
Cholesterol, Total: 205 mg/dL — ABNORMAL HIGH (ref 100–199)
HDL: 56 mg/dL (ref >39)
LDL Chol Calc (NIH): 139 mg/dL — ABNORMAL HIGH (ref 0–99)
Triglycerides: 57 mg/dL (ref 0–149)
VLDL Cholesterol Cal: 10 mg/dL (ref 5–40)

## 2024-04-14 MED ORDER — LISINOPRIL 5 MG PO TABS: 5.0000 mg | ORAL_TABLET | Freq: Every day | ORAL | 3 refills | Status: AC

## 2024-04-14 NOTE — Patient Instructions (Signed)
 Medication Instructions:  Your physician recommends that you continue on your current medications as directed. Please refer to the Current Medication list given to you today.   *If you need a refill on your cardiac medications before your next appointment, please call your pharmacy*  Lab Work: - CMET and fasting lipid panel today  If you have labs (blood work) drawn today and your tests are completely normal, you will receive your results only by: MyChart Message (if you have MyChart) OR A paper copy in the mail If you have any lab test that is abnormal or we need to change your treatment, we will call you to review the results.  Follow-Up: At [facility], you and your health needs are our priority.  As part of our continuing mission to provide you with exceptional heart care, our providers are all part of one team.  This team includes your primary Cardiologist (physician) and Advanced Practice Providers or APPs (Physician Assistants and Nurse Practitioners) who all work together to provide you with the care you need, when you need it.  Your next appointment:   1 year(s)  Provider:   Wilbert Bihari, MD    We recommend signing up for the patient portal called MyChart.  Sign up information is provided on this After Visit Summary.  MyChart is used to connect with patients for Virtual Visits (Telemedicine).  Patients are able to view lab/test results, encounter notes, upcoming appointments, etc.  Non-urgent messages can be sent to your provider as well.   To learn more about what you can do with MyChart, go to [email or web address].

## 2024-04-14 NOTE — Progress Notes (Signed)
 Cardiology  Note    Date:  04/14/2024   ID:  DAVIDE Morse, DOB 07-Feb-1966, MRN [id number]  PCP:  Nanci Senior, MD  Cardiologist:  Wilbert Bihari, MD   Chief Complaint  Patient presents with   Follow-up    History of chest pain, hypertension, SVT, hyperlipidemia    History of Present Illness:  Patrick Morse is a 58 y.o. male  with a hx of depression and anxiety, HLD, HTN, SVT s/p remote ablation (2003 [facility] Dr. Epifanio) and tobacco abuse who was initially referred for evaluation of DOE.   He underwent coronary CTA 06/17/2020 which showed a coronary calcium  score was 80 with 25 to 49% mid LAD with normal Lexiscan  Myoview .    He was last seen by Jackee Alberts on 04/19/2023 and was doing well.  He presented to [facility] on 09/01/2023 complaining of chest heaviness that has been intermittent throughout the week prior to his presentation.  He also has complaining of SOB.  He also was complaining of palpitations.  He had a HR in the 150's a few weeks ago [place]. On the morning of the ER visit his blood pressure was in the 170s to 180s systolic.  He tells me that his exwife recently passed away and he has been under a lot of stress with his kids going through their loss.  He also has been complaining of fatigue.  High-sensitivity troponin was normal x 2 and BNP was less than 36.  D-dimer was normal.  Chest x-ray was normal.  EKG showed normal sinus rhythm with possible prior anterior infarct no acute ST changes.   At last OV he had continued to have chest heaviness and  He says that the SOB is the worst.  He has quit smoking. He denies any PND orthopnea, lower extremity edema or syncope. He had a few episodes while driving to work and broke out in a sweat.  He underwent cardiac cath showing normal coronary arteries. 2D echo showed EF 60-65%, normal RV, trivial MR.    He is here today and is doing well.  He denies any chest pain or pressure, SOB, DOE,  PND, orthopnea, lower extremity edema, palpitations or syncope.  Occasionally he will get a dizzy spell if he moves too fast.  Past Medical History:  Diagnosis Date   Anxiety    Bilateral kidney stones    BPH (benign prostatic hyperplasia)    CAD (coronary artery disease), native coronary artery    coronary Ca score 80 with 25-49% mLAD by coronary CTA 04/2020 and normal Lexiscan  myoview  05/2020.  No CAD by cath 09/16/2023   Depression    Dyspepsia    Family hx of colon cancer    Hx of colonic polyps    Hyperlipidemia    Hypertension    SVT (supraventricular tachycardia)    s/p ablation at [facility] Dr. Epifanio 2003   Tobacco abuse     Past Surgical History:  Procedure Laterality Date   CARDIAC ELECTROPHYSIOLOGY STUDY AND ABLATION     LEFT HEART CATH AND CORONARY ANGIOGRAPHY N/A 09/05/2023   Procedure: LEFT HEART CATH AND CORONARY ANGIOGRAPHY;  Surgeon: Ladona Heinz, MD;  Location: [facility] INVASIVE [facility] LAB;  Service: Cardiovascular;  Laterality: N/A;    Current Medications: Current Meds  Medication Sig   [DISCONTINUED] lisinopril  (ZESTRIL ) 5 MG tablet Take 1 tablet (5 mg total) by mouth daily. Please call our office to discuss future refills of this medication.  Allergies:   Patient has no known allergies.   Social History   Socioeconomic History   Marital status: Married    Spouse name: Not on file   Number of children: Not on file   Years of education: Not on file   Highest education level: Not on file  Occupational History   Not on file  Tobacco Use   Smoking status: Every Day    Current packs/day: 0.50    Types: Cigarettes   Smokeless tobacco: Current  Substance and Sexual Activity   Alcohol use: Yes   Drug use: Yes    Types: Marijuana   Sexual activity: Not on file  Other Topics Concern   Not on file  Social History Narrative   Not on file   Social Drivers of Health   Financial Resource Strain: Not on file  Food Insecurity: Not on file  Transportation Needs:  Not on file  Physical Activity: Not on file  Stress: Not on file  Social Connections: Not on file     Family History:  The patient's family history is not on file.   ROS:   Please see the history of present illness.    ROS All other systems reviewed and are negative.      No data to display             PHYSICAL EXAM:   VS:  BP 124/72 (BP Location: Left Arm, Patient Position: Sitting, Cuff Size: Normal)   Pulse 62   Ht 5' 8 (1.727 m)   Wt 161 lb 6.4 oz (73.2 kg)   SpO2 98%   BMI 24.54 kg/m    GEN: Well nourished, well developed in no acute distress HEENT: Normal NECK: No JVD; No carotid bruits LYMPHATICS: No lymphadenopathy CARDIAC:RRR, no murmurs, rubs, gallops RESPIRATORY:  Clear to auscultation without rales, wheezing or rhonchi  ABDOMEN: Soft, non-tender, non-distended MUSCULOSKELETAL:  No edema; No deformity  SKIN: Warm and dry NEUROLOGIC:  Alert and oriented x 3 PSYCHIATRIC:  Normal affect  Wt Readings from Last 3 Encounters:  04/14/24 161 lb 6.4 oz (73.2 kg)  09/05/23 155 lb (70.3 kg)  09/04/23 158 lb (71.7 kg)      Studies/Labs Reviewed:    Recent Labs: No results found for requested labs within last 365 days.   Lipid Panel    Component Value Date/Time   CHOL 134 06/29/2022 0941   TRIG 72 06/29/2022 0941   HDL 48 06/29/2022 0941   CHOLHDL 2.8 06/29/2022 0941   LDLCALC 71 06/29/2022 0941       Additional studies/ records that were reviewed today include:  EKG Interpretation Date/Time:  Monday April 14 2024 08:12:58 EST Ventricular Rate:  62 PR Interval:  120 QRS Duration:  86 QT Interval:  378 QTC Calculation: 383 R Axis:   32  Text Interpretation: Normal sinus rhythm Normal ECG When compared with ECG of 19-Apr-2023 08:04, No significant change was found Confirmed by Shlomo Corning ([id number]) on 04/14/2024 8:25:44 AM      ASSESSMENT:    1. Chest pain of uncertain etiology   2. Benign essential HTN   3. SVT (supraventricular  tachycardia)   4. Dyslipidemia, goal LDL below 100       PLAN:  In order of problems listed above:  # History of chest pain -Coronary CTA 06/17/2020 which showed a coronary calcium  score was 80 with 25 to 49% mid LAD  -normal Lexiscan  Myoview  05/2020 -Cardiac cath 08/2023 with no CAD  -2D echo  08/2023 normal    #HTN -BP controlled on exam today at 124/72 mmHg -Continue prescription drug management with lisinopril  5 mg daily with as needed refills -Check BMP today  #SVT -s/p remote ablation 2003 -Denies any palpitations  #Hyperlipidemia -LDL goal<100  -Check FLP and ALT today -He was having some problems with hand cramps and stopped the Crestor  and the cramps stopped    Medication Adjustments/Labs and Tests Ordered: Current medicines are reviewed at length with the patient today.  Concerns regarding medicines are outlined above.  Medication changes, Labs and Tests ordered today are listed in the Patient Instructions below.  There are no Patient Instructions on file for this visit.   Signed, Wilbert Bihari, MD  04/14/2024 8:29 AM    [facility] [address] [place], [place], [place]  [address] Phone: [phone]; Fax: [phone]

## 2024-04-14 NOTE — Addendum Note (Signed)
 Addended by: Allan Bacigalupi C on: 04/14/2024 08:33 AM   Modules accepted: Orders

## 2024-04-15 ENCOUNTER — Ambulatory Visit: Payer: Self-pay | Admitting: Cardiology

## 2024-04-17 ENCOUNTER — Other Ambulatory Visit: Payer: Self-pay

## 2024-04-17 DIAGNOSIS — I25119 Atherosclerotic heart disease of native coronary artery with unspecified angina pectoris: Secondary | ICD-10-CM

## 2024-04-18 ENCOUNTER — Telehealth: Payer: Self-pay | Admitting: Cardiology

## 2024-04-18 NOTE — Telephone Encounter (Signed)
 VM box is full ; not able to leave a VM.  04/18/2024 at 11:35 am

## 2024-04-18 NOTE — Telephone Encounter (Signed)
 Patient would like for someone to call him to go over his lab results, she doesn't have access to MyChart.

## 2024-04-22 NOTE — Telephone Encounter (Signed)
 Attempted to return patient's call. VM box is full ; not able to leave a VM.

## 2024-04-23 NOTE — Telephone Encounter (Signed)
 S/w the patient and went over the information below. He has an appt with Pharmacy on 06/07/23. He verbalized understanding of all information.  Hi Patrick Morse, Dr. Shlomo reviewed your cholesterol labs. Your LDL is still elevated. She recommends you be referred to our lipid clinic. This is run by our pharmacists. They look into what you've been on before, what side effects you've had, even costs of medications and insurance coverage. The goal is to get you on medication or a combo of medications that control your cholesterol with a minimum of side effects. I will place that referral and someone will call you to set up an appointment. Let me know if you have any questions.  Geni, RN

## 2024-04-23 NOTE — Telephone Encounter (Signed)
 Pt calling back about results. Please advise.

## 2024-06-05 NOTE — Progress Notes (Signed)
 Patient ID: Patrick Morse                 DOB: [birth year]                    MRN: [id number]      HPI: Patrick Morse is a 59 y.o. male patient referred to lipid clinic by Patrick Bihari, MD. PMH is significant for HTN, HLD, SVT (s/p ablation 2003) and tobacco use.   Patient previously on rosuvastatin  20 mg but has stopped taking due to joint and hand cramps. Today he states he is willing to restart rosuvastatin  at the same dose. Patient states his diet is unhealthy consisting of fast food and high salt foods. In terms of exercise he walks 4 miles a day for 4-5 days a week but has stopped due to the cold weather. Resistance exercise includes push ups and he also plays golf occasionally. The importance of implementing lifestyle changes to assist in lowering LDL was discussed.   Reviewed options for lowering LDL cholesterol, including different statins, ezetimibe and PCSK-9 inhibitors  Discussed mechanisms of action, dosing, side effects and potential decreases in LDL cholesterol.  Also reviewed cost information and potential options for patient assistance.   Current Medications: N/A Intolerances: Rosuvastatin  20 mg (joint and hand cramps)  Risk Factors: HTN, former tobacco use  LDL goal: 30-49% reduction in LDL and LDL <100  Diet:  No Breakfast  L: Fast food  D: Fast food/home cook meal  Snacks: Peanut butter and crackers, apples Drinks: black coffee, water, occasional soda     Exercise: Walking 4 miles a day for 4-5 days a week until winter started. Resistance exercise includes push ups. Like's to play golf  Family History:  Mother: HTN  Father: Stroke (in mid 66s)   Social History:  Smoking: Former cigarette smoker 1 pack/day for last 30 years  Alcohol: 6 beers/day (on golfing days)  Drugs: Marijuana daily (smokes)   Labs:  Lipid Panel     Component Value Date/Time   CHOL 205 (H) 04/14/2024 0858   TRIG 57 04/14/2024 0858   HDL 56 04/14/2024 0858   CHOLHDL 3.7  04/14/2024 0858   LDLCALC 139 (H) 04/14/2024 0858   LABVLDL 10 04/14/2024 0858    Past Medical History:  Diagnosis Date   Anxiety    Bilateral kidney stones    BPH (benign prostatic hyperplasia)    CAD (coronary artery disease), native coronary artery    coronary Ca score 80 with 25-49% mLAD by coronary CTA 04/2020 and normal Lexiscan  myoview  05/2020.  No CAD by cath 09/16/2023   Depression    Dyspepsia    Family hx of colon cancer    Hx of colonic polyps    Hyperlipidemia    Hypertension    SVT (supraventricular tachycardia)    s/p ablation at [facility] Dr. Epifanio 2003   Tobacco abuse     Medications Ordered Prior to Encounter[1]  Allergies[2]  Assessment/Plan:  1. Hyperlipidemia -  Problem  Hyperlipidemia   Hyperlipidemia Assessment:  LDL goal: <100 mg/dl (last LDLc 860 mg/dl in 88/7974) Previously tolerated rosuvastatin  well until joint/hand cramps developed.   Discussed next potential options (different statins, ezetimibe, and PCSK-9 inhibitors); cost, dosing efficacy, side effects  Reiterated importance of regular exercise and healthy diet  Patient will like to restart rosuvastatin  at previous dose of 20 mg   Plan: Start taking rosuvastatin  20 mg, instructed patient to reach out if any side effects develop.  Will check lipid lab in 3 months (Aug 22, 2023) to assess rosuvastatin  response and assess liver function    Thank you,  Patrick Morse, PharmD Candidate   Patrick Morse, Pharm.D Patrick Elspeth BIRCH. [facility] [address], [place], [place] [address] Phone: [phone]; Fax: [phone]        [1]  Current Outpatient Medications on File Prior to Visit  Medication Sig Dispense Refill   aspirin  EC 81 MG tablet Take 1 tablet (81 mg total) by mouth daily. Swallow whole. (Patient not taking: Reported on 04/14/2024) 90 tablet 3   lisinopril  (ZESTRIL ) 5 MG tablet Take 1 tablet (5 mg total) by mouth daily. 90  tablet 3   No current facility-administered medications on file prior to visit.  [2] No Known Allergies

## 2024-06-06 ENCOUNTER — Encounter: Payer: Self-pay | Admitting: Pharmacist

## 2024-06-06 ENCOUNTER — Ambulatory Visit: Attending: Cardiology | Admitting: Pharmacist

## 2024-06-06 ENCOUNTER — Other Ambulatory Visit (HOSPITAL_COMMUNITY): Payer: Self-pay

## 2024-06-06 DIAGNOSIS — E785 Hyperlipidemia, unspecified: Secondary | ICD-10-CM | POA: Insufficient documentation

## 2024-06-06 MED ORDER — ROSUVASTATIN CALCIUM 20 MG PO TABS
20.0000 mg | ORAL_TABLET | Freq: Every day | ORAL | 3 refills | Status: AC
  Filled 2024-06-06: qty 30, 30d supply, fill #0

## 2024-06-06 NOTE — Assessment & Plan Note (Addendum)
 Assessment:  LDL goal: <100 mg/dl (last LDLc 860 mg/dl in 88/7974) Previously tolerated rosuvastatin  well until joint/hand cramps developed.   Discussed next potential options (different statins, ezetimibe, and PCSK-9 inhibitors); cost, dosing efficacy, side effects  Reiterated importance of regular exercise and healthy diet  Patient will like to restart rosuvastatin  at previous dose of 20 mg   Plan: Start taking rosuvastatin  20 mg, instructed patient to reach out if any side effects develop.  Will check lipid lab in 3 months (Aug 22, 2023) to assess rosuvastatin  response and assess liver function

## 2024-06-17 ENCOUNTER — Other Ambulatory Visit: Payer: Self-pay

## 2024-07-04 ENCOUNTER — Telehealth: Payer: Self-pay | Admitting: Cardiology

## 2024-07-04 NOTE — Telephone Encounter (Signed)
" °*  STAT* If patient is at the pharmacy, call can be transferred to refill team.   1. Which medications need to be refilled? (please list name of each medication and dose if known)   rosuvastatin  (CRESTOR ) 20 MG tablet     2. Would you like to learn more about the convenience, safety, & potential cost savings by using the [facility]? No      3. Are you open to using the [facility] (Type [facility]. No    4. Which pharmacy/location (including street and city if local pharmacy) is medication to be sent to? [id number] [place], [place] - [address] [place] [address]    5. Do they need a 30 day or 90 day supply? 90 day   "
